# Patient Record
Sex: Male | Born: 1945 | Race: White | Hispanic: No | Marital: Married | Smoking: Never smoker
Health system: Southern US, Community
[De-identification: ages and names within clinical notes are randomized; demographics above are authoritative.]

## PROBLEM LIST (undated history)

## (undated) DIAGNOSIS — M1990 Unspecified osteoarthritis, unspecified site: Secondary | ICD-10-CM

## (undated) DIAGNOSIS — E041 Nontoxic single thyroid nodule: Secondary | ICD-10-CM

## (undated) DIAGNOSIS — K649 Unspecified hemorrhoids: Secondary | ICD-10-CM

## (undated) DIAGNOSIS — D709 Neutropenia, unspecified: Secondary | ICD-10-CM

## (undated) DIAGNOSIS — M869 Osteomyelitis, unspecified: Secondary | ICD-10-CM

## (undated) DIAGNOSIS — H53123 Transient visual loss, bilateral: Secondary | ICD-10-CM

## (undated) DIAGNOSIS — E7800 Pure hypercholesterolemia, unspecified: Secondary | ICD-10-CM

## (undated) HISTORY — DX: Osteomyelitis, unspecified: M86.9

## (undated) HISTORY — PX: TONSILLECTOMY: SUR1361

## (undated) HISTORY — PX: COLONOSCOPY: SHX174

## (undated) HISTORY — DX: Neutropenia, unspecified: D70.9

## (undated) HISTORY — PX: TOTAL HIP ARTHROPLASTY: SHX124

## (undated) HISTORY — DX: Pure hypercholesterolemia, unspecified: E78.00

## (undated) HISTORY — DX: Unspecified hemorrhoids: K64.9

## (undated) HISTORY — DX: Unspecified osteoarthritis, unspecified site: M19.90

## (undated) HISTORY — DX: Transient visual loss, bilateral: H53.123

## (undated) HISTORY — PX: WISDOM TOOTH EXTRACTION: SHX21

---

## 2015-05-02 ENCOUNTER — Ambulatory Visit (INDEPENDENT_AMBULATORY_CARE_PROVIDER_SITE_OTHER): Payer: Medicare Other | Admitting: *Deleted

## 2015-05-02 DIAGNOSIS — Z23 Encounter for immunization: Secondary | ICD-10-CM | POA: Diagnosis not present

## 2017-05-18 ENCOUNTER — Ambulatory Visit (INDEPENDENT_AMBULATORY_CARE_PROVIDER_SITE_OTHER): Payer: Medicare Other | Admitting: *Deleted

## 2017-05-18 DIAGNOSIS — Z23 Encounter for immunization: Secondary | ICD-10-CM

## 2019-07-18 ENCOUNTER — Other Ambulatory Visit: Payer: Self-pay

## 2019-07-18 ENCOUNTER — Encounter: Payer: Self-pay | Admitting: Neurology

## 2019-07-18 ENCOUNTER — Ambulatory Visit (INDEPENDENT_AMBULATORY_CARE_PROVIDER_SITE_OTHER): Payer: Medicare Other | Admitting: Neurology

## 2019-07-18 VITALS — BP 146/85 | HR 60 | Temp 97.5°F | Ht 70.0 in | Wt 156.5 lb

## 2019-07-18 DIAGNOSIS — H53123 Transient visual loss, bilateral: Secondary | ICD-10-CM | POA: Diagnosis not present

## 2019-07-18 DIAGNOSIS — M5412 Radiculopathy, cervical region: Secondary | ICD-10-CM | POA: Diagnosis not present

## 2019-07-18 NOTE — Progress Notes (Signed)
PATIENT: Brandon Zamora DOB: 05/06/1946  Chief Complaint  Patient presents with  . Transient Blindness    Reports two events of transient event.  The first episode was 1.5 years ago and lasted 4-6 seconds.  The second time was last month and also lasted 4-6 seconds.   Marland Kitchen. 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 Early CharsLewis, William B for evaluation of transient blindness, initial evaluation was on July 18, 2019.  I have reviewed and summarized the referring note from the referring physician.  Brandon Zamora had past medical history of hyperlipidemia, taking Lipitor 10 mg at nighttime, has been taking aspirin 81 mg over the past 6 months  Brandon Zamora reported 2 similar episodes of sudden onset visual loss, most recent one was in November 2020, after playing tennis for about 30 minutes, Brandon Zamora was driving back home, with both eyes open, had a sudden onset loss of vision, lasting only for seconds, no loss of consciousness, vision quickly came back, no lateralized motor or sensory deficit  Initial episode was in 2019, very similar, after playing tennis, while Brandon Zamora was driving home, Brandon Zamora had a sudden onset visual loss lasting for few seconds.  Brandon Zamora denies palpitation, chest pain,  Brandon Zamora also complains of chronic neck pain, sometimes radiating pain to bilateral shoulders, and lateral arm, there was no weakness, but on today's examination, Brandon Zamora was found to have mild right shoulder abduction, external rotation weakness.  REVIEW OF SYSTEMS: Full 14 system review of systems performed and notable only for as above All other review of systems were negative.  ALLERGIES: No Known Allergies  HOME MEDICATIONS: Current Outpatient Medications  Medication Sig Dispense Refill  . aspirin 81 MG EC tablet Take 81 mg by mouth daily.     Marland Kitchen. atorvastatin (LIPITOR) 10 MG tablet Take 10 mg by mouth at bedtime.     . cetirizine (ZYRTEC) 10  MG tablet Take 10 mg by mouth daily.     . Coenzyme Q10 200 MG capsule Take 200 mg by mouth daily.    . Multiple Vitamin (MULTIVITAMIN) tablet Take 1 tablet by mouth daily.    . Triamcinolone Acetonide (TRIAMCINOLONE 0.1 % CREAM : EUCERIN) CREA Apply 1 application topically as needed.    . TURMERIC CURCUMIN PO Take 200 mg by mouth daily.     No current facility-administered medications for this visit.    PAST MEDICAL HISTORY: Past Medical History:  Diagnosis Date  . Degenerative joint disease   . Hemorrhoids   . Hypercholesteremia   . Neutropenia (HCC)   . Osteomyelitis hip (HCC)   . Transient blindness of both eyes     PAST SURGICAL HISTORY: Past Surgical History:  Procedure Laterality Date  . COLONOSCOPY    . TONSILLECTOMY    . TOTAL HIP ARTHROPLASTY Bilateral     FAMILY HISTORY: Family History  Problem Relation Age of Onset  . Dementia Mother   . Heart disease Father     SOCIAL HISTORY: Social History   Socioeconomic History  . Marital status: Married    Spouse name: Not on file  . Number of children: 2  . Years of education: college  . Highest education level: Doctorate  Occupational History  . Occupation: Retired  Tobacco Use  . Smoking status: Never Smoker  . Smokeless tobacco: Never Used  Substance and Sexual Activity  . Alcohol use: Never  .  Drug use: Never  . Sexual activity: Not on file  Other Topics Concern  . Not on file  Social History Narrative   Lives at home with his wife.   Right-handed.   2 cups caffeine per day.   Social Determinants of Health   Financial Resource Strain:   . Difficulty of Paying Living Expenses: Not on file  Food Insecurity:   . Worried About Programme researcher, broadcasting/film/video in the Last Year: Not on file  . Ran Out of Food in the Last Year: Not on file  Transportation Needs:   . Lack of Transportation (Medical): Not on file  . Lack of Transportation (Non-Medical): Not on file  Physical Activity:   . Days of Exercise per  Week: Not on file  . Minutes of Exercise per Session: Not on file  Stress:   . Feeling of Stress : Not on file  Social Connections:   . Frequency of Communication with Friends and Family: Not on file  . Frequency of Social Gatherings with Friends and Family: Not on file  . Attends Religious Services: Not on file  . Active Member of Clubs or Organizations: Not on file  . Attends Banker Meetings: Not on file  . Marital Status: Not on file  Intimate Partner Violence:   . Fear of Current or Ex-Partner: Not on file  . Emotionally Abused: Not on file  . Physically Abused: Not on file  . Sexually Abused: Not on file     PHYSICAL EXAM   Vitals:   07/18/19 1019  BP: (!) 146/85  Pulse: 60  Temp: (!) 97.5 F (36.4 C)  Weight: 156 lb 8 oz (71 kg)  Height: 5\' 10"  (1.778 m)    Not recorded      Body mass index is 22.46 kg/m.  PHYSICAL EXAMNIATION:  Gen: NAD, conversant, well nourised, well groomed                     Cardiovascular: Regular rate rhythm, no peripheral edema, warm, nontender. Eyes: Conjunctivae clear without exudates or hemorrhage Neck: Supple, no carotid bruits. Pulmonary: Clear to auscultation bilaterally   NEUROLOGICAL EXAM:  MENTAL STATUS: Speech:    Speech is normal; fluent and spontaneous with normal comprehension.  Cognition:     Orientation to time, place and person     Normal recent and remote memory     Normal Attention span and concentration     Normal Language, naming, repeating,spontaneous speech     Fund of knowledge   CRANIAL NERVES: CN II: Visual fields are full to confrontation. Pupils are round equal and briskly reactive to light. CN III, IV, VI: extraocular movement are normal. No ptosis. CN V: Facial sensation is intact to light touch CN VII: Face is symmetric with normal eye closure  CN VIII: Hearing is normal to causal conversation. CN IX, X: Phonation is normal. CN XI: Head turning and shoulder shrug are  intact  MOTOR: Brandon Zamora has mild right shoulder abduction, external rotation, right elbow flexion weakness.  REFLEXES: Reflexes decreased at right 1/left 2 at biceps, symmetric at bilateral triceps, knees, and ankles. Plantar responses are flexor.  SENSORY: Intact to light touch, pinprick and vibratory sensation are intact in fingers and toes.  COORDINATION: There is no trunk or limb dysmetria noted.  GAIT/STANCE: Posture is normal. Gait is steady with normal steps, base, arm swing, and turning. Heel and toe walking are normal. Tandem gait is normal.  Romberg is absent.  DIAGNOSTIC DATA (LABS, IMAGING, TESTING) - I reviewed patient records, labs, notes, testing and imaging myself where available.   ASSESSMENT AND PLAN  Brandon Zamora is a 73 y.o. male   Sudden onset bilateral visual loss  Potential differentiation diagnosis includes decreased perfusion to bilateral occipital region, posterior circulation insufficiency  Proceed with MRI of the brain, CT angiogram of head and neck  We will also refer him to cardiologist to rule out cardiac arrhythmia,  Neck pain, radiating pain to bilateral shoulder, arm, right shoulder abduction, external rotation, elbow flexion weakness, decreased to right bicep reflex  Need to rule out right cervical radiculopathy  Proceed with MRI of cervical spine  Marcial Pacas, M.D. Ph.D.  [facility] [address] [place], [place] [address] Ph: [phone] Fax: [phone]  CC: Galen Manila, MD

## 2019-08-09 ENCOUNTER — Ambulatory Visit: Payer: Medicare Other | Attending: Internal Medicine

## 2019-08-09 DIAGNOSIS — Z23 Encounter for immunization: Secondary | ICD-10-CM | POA: Insufficient documentation

## 2019-08-09 NOTE — Progress Notes (Signed)
   Covid-19 Vaccination Clinic  Name:  Cylus Douville    MRN: [id number] DOB: 03-24-1946  08/09/2019  Mr. Ulatowski was observed post Covid-19 immunization for 15 minutes without incidence. He was provided with Vaccine Information Sheet and instruction to access the V-Safe system.   Mr. Ishaq was instructed to call 911 with any severe reactions post vaccine: Marland Kitchen Difficulty breathing  . Swelling of your face and throat  . A fast heartbeat  . A bad rash all over your body  . Dizziness and weakness    Immunizations Administered    Name Date Dose VIS Date Route   Pfizer COVID-19 Vaccine 08/09/2019  6:20 PM 0.3 mL 06/30/2019 Intramuscular   Manufacturer: [employer], [employer]   Lot: [id number]   NDC: [address]-1

## 2019-08-16 ENCOUNTER — Ambulatory Visit
Admission: RE | Admit: 2019-08-16 | Discharge: 2019-08-16 | Disposition: A | Discharge status: Home / Self Care | Payer: Medicare Other | Source: Ambulatory Visit | Attending: Neurology | Admitting: Neurology

## 2019-08-16 ENCOUNTER — Telehealth: Payer: Self-pay | Admitting: Neurology

## 2019-08-16 ENCOUNTER — Other Ambulatory Visit: Payer: Self-pay

## 2019-08-16 DIAGNOSIS — M5412 Radiculopathy, cervical region: Secondary | ICD-10-CM

## 2019-08-16 DIAGNOSIS — H53123 Transient visual loss, bilateral: Secondary | ICD-10-CM

## 2019-08-16 MED ORDER — IOPAMIDOL (ISOVUE-370) INJECTION 76%
75.0000 mL | Freq: Once | INTRAVENOUS | Status: AC | PRN
  Administered 2019-08-16: 75 mL via INTRAVENOUS

## 2019-08-16 NOTE — Telephone Encounter (Signed)
I have spoke to the patient and provided him with the results below. He verbalized understanding. He will contact his PCP to schedule evaluation of the thyroid findings.

## 2019-08-16 NOTE — Telephone Encounter (Signed)
Please call patient, CT angiogram of head and neck showed mild age-related changes, there is no significant large vessel disease,  Incidental findings of thyroid nodule at the right lower pole, with areas of calcification, maximum diameter 2.4 cm, radiology has recommended thyroid ultrasound, I have forward reported to his primary care physician Dr. 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 No intracranial anterior circulation large or medium vessel occlusion or significant distal vessel atherosclerotic irregularity.  No posterior circulation pathology. Both vertebral arteries widely patent. Posterior circulation branch vessels appear patent and normal.  Dominant thyroid nodule lower pole on the right with areas of calcification. Maximal diameter 2.4 cm. Recommend thyroid US (ref: J Am Coll Radiol. 2015 Feb;12(2): 143-50).

## 2019-08-18 ENCOUNTER — Other Ambulatory Visit: Payer: Self-pay | Admitting: Internal Medicine

## 2019-08-18 DIAGNOSIS — E079 Disorder of thyroid, unspecified: Secondary | ICD-10-CM

## 2019-08-21 ENCOUNTER — Telehealth: Payer: Self-pay | Admitting: Neurology

## 2019-08-21 NOTE — Telephone Encounter (Signed)
Left message requesting a all back to review the MRI results.  He was provided with his CTA of head and neck results last week. He verbalized understanding to follow up with his PCP concerning the thyroid findings.

## 2019-08-21 NOTE — Telephone Encounter (Signed)
I spoke to the patient and provided him with his MRI results outlined below.   He has already seen his PCP about the thyroid findings and has a pending appt for the ultrasound on 08/22/2019.

## 2019-08-21 NOTE — Telephone Encounter (Signed)
Please call patient, MRI of cervical spine showed multilevel degenerative changes,  severe bilateral foraminal stenosis at C3-4, C4-5, C6-7 level, severe right and moderate left foraminal stenosis at C5-6,  There is no evidence of spinal cord compression  MRI of the brain showed mild supratentorium small vessel disease, no acute abnormality  CT angiogram of head and neck showed no significant large vessel disease  Incidental findings of right thyroid lower pole nodule, maximum diameter 2.4 cm, recommended thyroid ultrasound, I have faxed the results to his primary care physician, Dr. Early Chars, he may contact Dr. Melvyn Neth for further thyroid evaluation.  MRI cervical spine (without)  - At C3-4, C4-5: disc bulging and uncovertebral joint hypertrophy with mild spinal stenosis and severe biforaminal stenosis; no cord signal changes. - At C5-6: disc bulging and uncovertebral joint hypertrophy with mild spinal stenosis, severe right and moderate left foraminal stenosis; no cord signal changes. - At C6-7: disc bulging and uncovertebral joint hypertrophy with severe biforaminal stenosis.  IMPRESSION:   MRI brain (without) demonstrating: - Mild scattered periventricular and subcortical foci of chronic small vessel ischemic disease. - No acute findings.  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 No intracranial anterior circulation large or medium vessel occlusion or significant distal vessel atherosclerotic irregularity.  No posterior circulation pathology. Both vertebral arteries widely patent. Posterior circulation branch vessels appear patent and normal.  Dominant thyroid nodule lower pole on the right with areas of calcification. Maximal diameter 2.4 cm. Recommend thyroid US (ref: J Am Coll Radiol. 2015 Feb;12(2): 143-50).

## 2019-08-22 ENCOUNTER — Ambulatory Visit
Admission: RE | Admit: 2019-08-22 | Discharge: 2019-08-22 | Disposition: A | Discharge status: Home / Self Care | Payer: Medicare Other | Source: Ambulatory Visit | Attending: Internal Medicine | Admitting: Internal Medicine

## 2019-08-22 DIAGNOSIS — E079 Disorder of thyroid, unspecified: Secondary | ICD-10-CM

## 2019-08-30 ENCOUNTER — Ambulatory Visit: Payer: Medicare Other | Attending: Internal Medicine

## 2019-08-30 DIAGNOSIS — Z23 Encounter for immunization: Secondary | ICD-10-CM | POA: Insufficient documentation

## 2019-08-30 NOTE — Progress Notes (Signed)
   Covid-19 Vaccination Clinic  Name:  Brandon Zamora    MRN: [id number] DOB: [birth year]  08/30/2019  Brandon Zamora was observed post Covid-19 immunization for 15 minutes without incidence. He was provided with Vaccine Information Sheet and instruction to access the V-Safe system.   Brandon Zamora was instructed to call 911 with any severe reactions post vaccine: Marland Kitchen Difficulty breathing  . Swelling of your face and throat  . A fast heartbeat  . A bad rash all over your body  . Dizziness and weakness    Immunizations Administered    Name Date Dose VIS Date Route   Pfizer COVID-19 Vaccine 08/30/2019  8:34 AM 0.3 mL 06/30/2019 Intramuscular   Manufacturer: [employer], [employer]   Lot: [id number]   NDC: [address]-1

## 2019-09-04 ENCOUNTER — Other Ambulatory Visit: Payer: Self-pay

## 2019-09-07 ENCOUNTER — Other Ambulatory Visit: Payer: Self-pay

## 2019-09-07 ENCOUNTER — Ambulatory Visit (INDEPENDENT_AMBULATORY_CARE_PROVIDER_SITE_OTHER): Payer: Medicare Other | Admitting: Internal Medicine

## 2019-09-07 DIAGNOSIS — E785 Hyperlipidemia, unspecified: Secondary | ICD-10-CM | POA: Diagnosis not present

## 2019-09-07 DIAGNOSIS — G453 Amaurosis fugax: Secondary | ICD-10-CM

## 2019-09-07 DIAGNOSIS — E042 Nontoxic multinodular goiter: Secondary | ICD-10-CM | POA: Diagnosis not present

## 2019-09-07 MED ORDER — ATORVASTATIN CALCIUM 20 MG PO TABS: 20.0000 mg | ORAL_TABLET | Freq: Every day | ORAL | 3 refills | Status: DC

## 2019-09-07 NOTE — Progress Notes (Signed)
Virtual Visit via Video Note  I connected with Brandon Zamora on 09/07/19 at 9:30 AM by a video enabled telemedicine application and verified that I am speaking with the correct person using two identifiers.   I discussed the limitations of evaluation and management by telemedicine and the availability of in person appointments. The patient expressed understanding and agreed to proceed.  -Location of the patient : Home  -Location of the provider : office  -The names of all persons participating in the telemedicine service : Pt and myself        Name: Brandon Zamora  MRN/ DOB: [id number], 09/15/1945    Age/ Sex: 74 y.o., male    PCP: Galen Manila, MD   Reason for Endocrinology Evaluation: Thyroid nodule      Date of Initial Endocrinology Evaluation: 09/07/2019     HPI: Brandon Zamora is a 74 y.o. male with a past medical history of dyslipidemia.The patient presented for initial endocrinology clinic visit on 09/07/2019 for consultative assistance with his thyroid nodules      Brandon Zamora presented to [facility] neurology for evaluation of transient loss of vision.  During imaging he was found to have an incidental right thyroid nodule on the CT scan.  This prompted a thyroid ultrasound which showed a right inferior thyroid nodule with a max diameter of 3.1 cm meeting criteria for FNA.  He also was found to have a left inferior thyroid nodule that does not meet criteria for FNA but would require an evaluation in 1 year.    Today he denies any local neck symptoms such as swelling, dysphagia, or pain.  He has minimal sensation of difficulty swallowing when lying supine.  Patient denies any weight changes, palpitations, or tremors.  He does endorse new onset constipation and cold intolerance.   He has been on Lipitor 10 mg for the past 2 years, he did have initial complaints of muscle aches and pains and was advised by cardiology to start CO Q 10 which has improved his  symptoms.      HISTORY:  Past Medical History:  Past Medical History:  Diagnosis Date  . Degenerative joint disease   . Hemorrhoids   . Hypercholesteremia   . Neutropenia ([facility])   . Osteomyelitis hip ([place])   . Transient blindness of both eyes     Past Surgical History:  Past Surgical History:  Procedure Laterality Date  . COLONOSCOPY    . TONSILLECTOMY    . TOTAL HIP ARTHROPLASTY Bilateral       Social History:  reports that he has never smoked. He has never used smokeless tobacco. He reports that he does not drink alcohol or use drugs.  Family History: family history includes Dementia in his mother; Heart disease in his father.   HOME MEDICATIONS: Allergies as of 09/07/2019   No Known Allergies     Medication List       Accurate as of September 07, 2019  8:23 AM. If you have any questions, ask your nurse or doctor.        aspirin 81 MG EC tablet Take 81 mg by mouth daily.   atorvastatin 10 MG tablet Commonly known as: LIPITOR Take 10 mg by mouth at bedtime.   cetirizine 10 MG tablet Commonly known as: ZYRTEC Take 10 mg by mouth daily.   Coenzyme Q10 200 MG capsule Take 200 mg by mouth daily.   multivitamin tablet Take 1 tablet by mouth daily.   triamcinolone 0.1 % cream :  eucerin Crea Apply 1 application topically as needed.   TURMERIC CURCUMIN PO Take 200 mg by mouth daily.         REVIEW OF SYSTEMS: A comprehensive ROS was conducted with the patient and is negative except as per HPI     DATA REVIEWED: 06/28/2019   LDL 140.6 mg/dL  Bun/Cr. 14/0.77  GFR 91  Ca 9.2    ASSESSMENT/PLAN/RECOMMENDATIONS:   1. Multinodular Goiter:   -Clinically he has some symptoms of hypothyroidism.  No TFTs available at this time. -No local neck symptoms -We discussed that multinodular goiter is a very common condition and most of the time patients are asymptomatic and found incidentally as in his case.  We discussed that 95 to 97% of thyroid nodules  are benign. -We reviewed his thyroid ultrasound results, patient in agreement to proceed with FNA of the right inferior thyroid nodule. -Orders have been entered -Patient will stop by for TSH and free T4 check on the day he comes for FNA.  2.  Dyslipidemia/amaurosis fugax:  -Patient is under the care of neurology, he is already on daily aspirin -I have reviewed his LDL levels from December 2020 through [facility], LDL level is 140 mg/dL.  I have advised the patient to increase Lipitor to 20 mg daily. -Since he has been on the CO Q10, his myalgias have subsided.  I have advised him that if he has myalgias he may start taking vitamin D3 1000 IU daily, I have attempted to order this for him but unfortunately this is not covered by his insurance. -We will need to repeat panels within few months of increasing the dose of Lipitor   Medications : Increase Lipitor to 20 mg daily  Follow-up in 6 months  Signed electronically by: Lyndle Herrlich, MD  [facility] Endocrinology  [facility] [address] [place] [place], [place] [address] Phone: [phone] FAX: [phone]   CC: Theodoro Kos, MD [id number] [facility] [address] [place] [address] Phone: [phone] Fax: [phone]   Return to Endocrinology clinic as below: Future Appointments  Date Time Provider Department Center  09/07/2019  9:30 AM Cleston Lautner, Konrad Dolores, MD [facility] None  10/17/2019 11:00 AM Levert Feinstein, MD [facility] None

## 2019-09-19 ENCOUNTER — Other Ambulatory Visit (INDEPENDENT_AMBULATORY_CARE_PROVIDER_SITE_OTHER): Payer: Medicare Other

## 2019-09-19 ENCOUNTER — Other Ambulatory Visit (HOSPITAL_COMMUNITY)
Admission: RE | Admit: 2019-09-19 | Discharge: 2019-09-19 | Disposition: A | Discharge status: Home / Self Care | Payer: Medicare Other | Source: Ambulatory Visit | Attending: Radiology | Admitting: Radiology

## 2019-09-19 ENCOUNTER — Ambulatory Visit
Admission: RE | Admit: 2019-09-19 | Discharge: 2019-09-19 | Disposition: A | Discharge status: Home / Self Care | Payer: Medicare Other | Source: Ambulatory Visit | Attending: Internal Medicine | Admitting: Internal Medicine

## 2019-09-19 ENCOUNTER — Other Ambulatory Visit: Payer: Self-pay

## 2019-09-19 DIAGNOSIS — E042 Nontoxic multinodular goiter: Secondary | ICD-10-CM | POA: Insufficient documentation

## 2019-09-19 LAB — T4, FREE: Free T4: 0.76 ng/dL (ref 0.60–1.60)

## 2019-09-19 LAB — TSH: TSH: 3.53 u[IU]/mL (ref 0.35–4.50)

## 2019-09-20 ENCOUNTER — Telehealth: Payer: Self-pay | Admitting: Internal Medicine

## 2019-09-20 LAB — CYTOLOGY - NON PAP

## 2019-09-20 NOTE — Telephone Encounter (Signed)
Discussed lab results as below    Results for Brandon Zamora, Brandon Zamora (MRN [id number]) as of 09/20/2019 16:56  Ref. Range 09/19/2019 09:06  TSH Latest Ref Range: 0.35 - 4.50 uIU/mL 3.53  T4,Free(Direct) Latest Ref Range: 0.60 - 1.60 ng/dL 9.53     Clinical History: Right inferior 3.1cm; Other 2 dimensions: 2.0 x 1.7cm,  Peripheral calcifications, TI-RADS total points 6  Specimen Submitted: A. THYROID, RLP, FINE NEEDLE ASPIRATION:    FINAL MICROSCOPIC DIAGNOSIS:  - Follicular lesion of undetermined significance (Bethesda category III)     Awaiting on Afirma , which may take 2-4 weeks   Pt expressed understanding of the above    Abby Raelyn Mora, MD  [facility] Endocrinology  [facility] [address] [place], [place] [address] Phone: [phone] FAX: [phone]

## 2019-10-03 ENCOUNTER — Telehealth: Payer: Self-pay | Admitting: Internal Medicine

## 2019-10-03 NOTE — Telephone Encounter (Signed)
Brandon Zamora , can you please  Let Dr. Gerre Pebbles know his afirma testing ( which is a molecular testing for cancer genes) on his thyroid nodule has come back  Benign.    No further intervention is needed at this time, will recheck on his next visit          Thanks    Abby Raelyn Mora, MD  [facility] Endocrinology  [facility] [address] [place], [place] [address] Phone: [phone] FAX: [phone]

## 2019-10-03 NOTE — Telephone Encounter (Signed)
Lft vm to return call 

## 2019-10-03 NOTE — Telephone Encounter (Signed)
Pt aware of results 

## 2019-10-13 ENCOUNTER — Encounter (HOSPITAL_COMMUNITY): Payer: Self-pay

## 2019-10-17 ENCOUNTER — Ambulatory Visit (INDEPENDENT_AMBULATORY_CARE_PROVIDER_SITE_OTHER): Payer: Medicare Other | Admitting: Neurology

## 2019-10-17 ENCOUNTER — Other Ambulatory Visit: Payer: Self-pay

## 2019-10-17 ENCOUNTER — Encounter: Payer: Self-pay | Admitting: Neurology

## 2019-10-17 VITALS — BP 158/83 | HR 53 | Temp 97.5°F | Ht 70.0 in | Wt 160.0 lb

## 2019-10-17 DIAGNOSIS — H53123 Transient visual loss, bilateral: Secondary | ICD-10-CM

## 2019-10-17 DIAGNOSIS — M5412 Radiculopathy, cervical region: Secondary | ICD-10-CM

## 2019-10-17 NOTE — Progress Notes (Addendum)
PATIENT: Brandon Zamora DOB: [birth year]  Chief Complaint  Patient presents with  . Transient blindness of eyes/Neck Pain    He is here to review his MRI brain, CTA head/neck, MRI cervical results.     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 Early Chars B for evaluation of transient blindness, initial evaluation was on July 18, 2019.  I have reviewed and summarized the referring note from the referring physician.  He had past medical history of hyperlipidemia, taking Lipitor 10 mg at nighttime, has been taking aspirin 81 mg over the past 6 months  He reported 2 similar episodes of sudden onset visual loss, most recent one was in November 2020, after playing tennis for about 30 minutes, he was driving back home, with both eyes open, had a sudden onset loss of vision, lasting only for seconds, no loss of consciousness, vision quickly came back, no lateralized motor or sensory deficit  Initial episode was in 2019, very similar, after playing tennis, while he was driving home, he had a sudden onset visual loss lasting for few seconds.  He denies palpitation, chest pain,  He also complains of chronic neck pain, sometimes radiating pain to bilateral shoulders, and lateral arm, there was no weakness, but on today's examination, he was found to have mild right shoulder abduction, external rotation weakness.  UPDATE October 17 2019: He continues to do well, there was no recurrent episode of transient visual loss, cardiac work-up is pending  He has intermittent neck pain, occasionally radiating pain to right shoulder, he also reported a history of right shoulder injury in the past, but denies limited range of motion of right shoulder, denied noticeable muscle weakness, on examination, he was noted to have mild right shoulder abduction weakness  We have personally reviewed MRI of the brain without contrast in January  2021, mild age-related changes, scattered subcortical small vessel disease, no acute abnormality  MRI of cervical spine, multilevel degenerative changes, involving C3-4, C4-5, C5-6, C6-7, no evidence of cord compression, variable degree of foraminal narrowing, incidental findings of right thyroid nodule, biopsy showed no significant abnormality.  CT angiogram of head and neck showed no significant large vessel disease  Laboratory evaluation showed normal thyroid functional test,  REVIEW OF SYSTEMS: Full 14 system review of systems performed and notable only for as above All other review of systems were negative.  ALLERGIES: No Known Allergies  HOME MEDICATIONS: Current Outpatient Medications  Medication Sig Dispense Refill  . aspirin 81 MG EC tablet Take 81 mg by mouth daily.     Marland Kitchen atorvastatin (LIPITOR) 20 MG tablet Take 1 tablet (20 mg total) by mouth daily. 90 tablet 3  . cetirizine (ZYRTEC) 10 MG tablet Take 10 mg by mouth daily.     . Coenzyme Q10 200 MG capsule Take 200 mg by mouth daily.    . Multiple Vitamin (MULTIVITAMIN) tablet Take 1 tablet by mouth daily.    . Triamcinolone Acetonide (TRIAMCINOLONE 0.1 % CREAM : EUCERIN) CREA Apply 1 application topically as needed.    . TURMERIC CURCUMIN PO Take 200 mg by mouth daily.     No current facility-administered medications for this visit.    PAST MEDICAL HISTORY: Past Medical History:  Diagnosis Date  . Degenerative joint disease   . Hemorrhoids   . Hypercholesteremia   . Neutropenia (HCC)   . Osteomyelitis hip (HCC)   . Transient blindness of both eyes  PAST SURGICAL HISTORY: Past Surgical History:  Procedure Laterality Date  . COLONOSCOPY    . TONSILLECTOMY    . TOTAL HIP ARTHROPLASTY Bilateral     FAMILY HISTORY: Family History  Problem Relation Age of Onset  . Dementia Mother   . Heart disease Father     SOCIAL HISTORY: Social History   Socioeconomic History  . Marital status: Married    Spouse  name: Not on file  . Number of children: 2  . Years of education: college  . Highest education level: Doctorate  Occupational History  . Occupation: Retired  Tobacco Use  . Smoking status: Never Smoker  . Smokeless tobacco: Never Used  Substance and Sexual Activity  . Alcohol use: Never  . Drug use: Never  . Sexual activity: Not on file  Other Topics Concern  . Not on file  Social History Narrative   Lives at home with his wife.   Right-handed.   2 cups caffeine per day.   Social Determinants of Health   Financial Resource Strain:   . Difficulty of Paying Living Expenses:   Food Insecurity:   . Worried About Programme researcher, broadcasting/film/video in the Last Year:   . Barista in the Last Year:   Transportation Needs:   . Freight forwarder (Medical):   Marland Kitchen Lack of Transportation (Non-Medical):   Physical Activity:   . Days of Exercise per Week:   . Minutes of Exercise per Session:   Stress:   . Feeling of Stress :   Social Connections:   . Frequency of Communication with Friends and Family:   . Frequency of Social Gatherings with Friends and Family:   . Attends Religious Services:   . Active Member of Clubs or Organizations:   . Attends Banker Meetings:   Marland Kitchen Marital Status:   Intimate Partner Violence:   . Fear of Current or Ex-Partner:   . Emotionally Abused:   Marland Kitchen Physically Abused:   . Sexually Abused:      PHYSICAL EXAM   Vitals:   10/17/19 1054  BP: (!) 158/83  Pulse: (!) 53  Temp: (!) 97.5 F (36.4 C)  Weight: 160 lb (72.6 kg)  Height: 5\' 10"  (1.778 m)    Not recorded      Body mass index is 22.96 kg/m.  PHYSICAL EXAMNIATION:  Gen: NAD, conversant, well nourised, well groomed                     Cardiovascular: Regular rate rhythm, no peripheral edema, warm, nontender. Eyes: Conjunctivae clear without exudates or hemorrhage Neck: Supple, no carotid bruits. Pulmonary: Clear to auscultation bilaterally   NEUROLOGICAL EXAM:  MENTAL  STATUS: Speech:    Speech is normal; fluent and spontaneous with normal comprehension.  Cognition:     Orientation to time, place and person     Normal recent and remote memory     Normal Attention span and concentration     Normal Language, naming, repeating,spontaneous speech     Fund of knowledge   CRANIAL NERVES: CN II: Visual fields are full to confrontation. Pupils are round equal and briskly reactive to light. CN III, IV, VI: extraocular movement are normal. No ptosis. CN V: Facial sensation is intact to light touch CN VII: Face is symmetric with normal eye closure  CN VIII: Hearing is normal to causal conversation. CN IX, X: Phonation is normal. CN XI: Head turning and shoulder shrug are intact  MOTOR: He has mild right shoulder abduction, external rotation weakness  REFLEXES: Reflexes decreased at right 1/left 2 at biceps, symmetric at bilateral triceps, knees, and ankles. Plantar responses are flexor.  SENSORY: Intact to light touch, pinprick and vibratory sensation are intact in fingers and toes.  COORDINATION: There is no trunk or limb dysmetria noted.  GAIT/STANCE: Posture is normal. Gait is steady with normal steps, base, arm swing, and turning.   DIAGNOSTIC DATA (LABS, IMAGING, TESTING) - I reviewed patient records, labs, notes, testing and imaging myself where available.   ASSESSMENT AND PLAN  Brandon Zamora is a 74 y.o. male   Sudden onset bilateral visual loss  Potential differentiation diagnosis includes decreased perfusion to bilateral occipital region, posterior circulation insufficiency  MRI of the brain showed no significant abnormality  CT angiogram of head and neck was normal  Cardiac work-up is pending  Neck pain, radiating pain to bilateral shoulder, arm, right shoulder abduction, external rotation weakness  MRI of cervical spine showed multilevel cervical degenerative changes, no evidence of cord compression, severe bilateral foraminal  stenosis at C3-4, C4-5, C6-7, severe right, moderate left foraminal stenosis at C5-6,  I have suggested him continue moderate exercise, stretching exercise, hot compression,  Total time spent reviewing the chart, obtaining history, examined patient, ordering tests, documentation, consultations and family, care coordination was 30 minutes   Marcial Pacas, M.D. Ph.D.  [facility] [address], [place], [place] [address] Ph: [phone] Fax: [phone]  CC: Galen Manila, MD  Addendum: by Dr. Deri Fuelling C3-4, C4-5, C5-6, C6-7 anterior cervical discectomy with interbody fusion utilizing interbody peek cages and locally harvested autograft with anterior plate instrumentation

## 2019-11-14 ENCOUNTER — Telehealth: Payer: Self-pay | Admitting: Neurology

## 2019-11-14 DIAGNOSIS — M5412 Radiculopathy, cervical region: Secondary | ICD-10-CM

## 2019-11-14 NOTE — Telephone Encounter (Signed)
Dr Terrace Arabia, thank you for your quick response.   I visited my family physician today and he suggested I ask you to refer me to a Neurosurgeon  to look at the MRI and suggest treatment options.   If anything, my loss of strength and mobility have increased since I visited with you.                                                                                            Best regards,  Brandon Zamora   Referred to neurosurgeon for right cervical radiculopathy

## 2019-11-15 NOTE — Telephone Encounter (Signed)
Referral has been sent via Fax

## 2019-11-27 ENCOUNTER — Other Ambulatory Visit: Payer: Self-pay | Admitting: Neurosurgery

## 2019-12-06 NOTE — Progress Notes (Signed)
[facility] [place] - [address] [place] [address] Phone: [phone] Fax: [phone]   Your procedure is scheduled on Friday, May 28th.  Report to [facility] Main Entrance "A" at 6:00 A.M., and check in at the Admitting office.  Call this number if you have problems the morning of surgery:  [phone]  Call [phone] if you have any questions prior to your surgery date Monday-Friday 8am-4pm   Remember:  Do not eat or drink after midnight the night before your surgery   Take these medicines the morning of surgery with A SIP OF WATER  atorvastatin (LIPITOR) cetirizine (ZYRTEC)  Follow your surgeon's instructions on when to stop Aspirin.  If no instructions were given by your surgeon then you will need to call the office to get those instructions.    As of today, STOP taking any Aspirin (unless otherwise instructed by your surgeon) and Aspirin containing products, Aleve, Naproxen, Ibuprofen, Motrin, Advil, Goody's, BC's, all herbal medications, fish oil, and all vitamins.                     Do not wear jewelry.            Do not wear lotions, powders, colognes, or deodorant.            Men may shave face and neck.            Do not bring valuables to the hospital.            [facility] is not responsible for any belongings or valuables.  Do NOT Smoke (Tobacco/Vapping) or drink Alcohol 24 hours prior to your procedure If you use a CPAP at night, you may bring all equipment for your overnight stay.   Contacts, glasses, dentures or bridgework may not be worn into surgery.      For patients admitted to the hospital, discharge time will be determined by your treatment team.   Patients discharged the day of surgery will not be allowed to drive home, and someone needs to stay with them for 24 hours.  Special instructions:   [place]- Preparing For Surgery  Before surgery, you can play an important role. Because skin is not  sterile, your skin needs to be as free of germs as possible. You can reduce the number of germs on your skin by washing with CHG (chlorahexidine gluconate) Soap before surgery.  CHG is an antiseptic cleaner which kills germs and bonds with the skin to continue killing germs even after washing.    Oral Hygiene is also important to reduce your risk of infection.  Remember - BRUSH YOUR TEETH THE MORNING OF SURGERY WITH YOUR REGULAR TOOTHPASTE  Please do not use if you have an allergy to CHG or antibacterial soaps. If your skin becomes reddened/irritated stop using the CHG.  Do not shave (including legs and underarms) for at least 48 hours prior to first CHG shower. It is OK to shave your face.  Please follow these instructions carefully.   1. Shower the NIGHT BEFORE SURGERY and the MORNING OF SURGERY with CHG Soap.   2. If you chose to wash your hair, wash your hair first as usual with your normal shampoo.  3. After you shampoo, rinse your hair and body thoroughly to remove the shampoo.  4. Use CHG as you would any other liquid soap. You can apply CHG directly to the skin and wash gently with  a scrungie or a clean washcloth.   5. Apply the CHG Soap to your body ONLY FROM THE NECK DOWN.  Do not use on open wounds or open sores. Avoid contact with your eyes, ears, mouth and genitals (private parts). Wash Face and genitals (private parts)  with your normal soap.   6. Wash thoroughly, paying special attention to the area where your surgery will be performed.  7. Thoroughly rinse your body with warm water from the neck down.  8. DO NOT shower/wash with your normal soap after using and rinsing off the CHG Soap.  9. Pat yourself dry with a CLEAN TOWEL.  10. Wear CLEAN PAJAMAS to bed the night before surgery, wear comfortable clothes the morning of surgery  11. Place CLEAN SHEETS on your bed the night of your first shower and DO NOT SLEEP WITH PETS.  Day of Surgery: Shower with CHG as instructed  above.  Do not apply any deodorants/lotions.  Please wear clean clothes to the hospital/surgery center.   Remember to brush your teeth WITH YOUR REGULAR TOOTHPASTE.   Please read over the following fact sheets that you were given.

## 2019-12-07 ENCOUNTER — Encounter (HOSPITAL_COMMUNITY): Payer: Self-pay

## 2019-12-07 ENCOUNTER — Encounter (HOSPITAL_COMMUNITY)
Admission: RE | Admit: 2019-12-07 | Discharge: 2019-12-07 | Disposition: A | Discharge status: Home / Self Care | Payer: Medicare Other | Source: Ambulatory Visit | Attending: Neurosurgery | Admitting: Neurosurgery

## 2019-12-07 ENCOUNTER — Other Ambulatory Visit: Payer: Self-pay

## 2019-12-07 DIAGNOSIS — H53123 Transient visual loss, bilateral: Secondary | ICD-10-CM | POA: Insufficient documentation

## 2019-12-07 DIAGNOSIS — R001 Bradycardia, unspecified: Secondary | ICD-10-CM | POA: Insufficient documentation

## 2019-12-07 DIAGNOSIS — Z01818 Encounter for other preprocedural examination: Secondary | ICD-10-CM | POA: Diagnosis not present

## 2019-12-07 DIAGNOSIS — I440 Atrioventricular block, first degree: Secondary | ICD-10-CM | POA: Diagnosis not present

## 2019-12-07 HISTORY — DX: Nontoxic single thyroid nodule: E04.1

## 2019-12-07 LAB — CBC WITH DIFFERENTIAL/PLATELET
Abs Immature Granulocytes: 0.01 10*3/uL (ref 0.00–0.07)
Basophils Absolute: 0 10*3/uL (ref 0.0–0.1)
Basophils Relative: 1 %
Eosinophils Absolute: 0.1 10*3/uL (ref 0.0–0.5)
Eosinophils Relative: 2 %
HCT: 45.9 % (ref 39.0–52.0)
Hemoglobin: 15 g/dL (ref 13.0–17.0)
Immature Granulocytes: 0 %
Lymphocytes Relative: 26 %
Lymphs Abs: 0.8 10*3/uL (ref 0.7–4.0)
MCH: 31.1 pg (ref 26.0–34.0)
MCHC: 32.7 g/dL (ref 30.0–36.0)
MCV: 95 fL (ref 80.0–100.0)
Monocytes Absolute: 0.3 10*3/uL (ref 0.1–1.0)
Monocytes Relative: 11 %
Neutro Abs: 2 10*3/uL (ref 1.7–7.7)
Neutrophils Relative %: 60 %
Platelets: 223 10*3/uL (ref 150–400)
RBC: 4.83 MIL/uL (ref 4.22–5.81)
RDW: 12.9 % (ref 11.5–15.5)
WBC: 3.2 10*3/uL — ABNORMAL LOW (ref 4.0–10.5)
nRBC: 0 % (ref 0.0–0.2)

## 2019-12-07 LAB — BASIC METABOLIC PANEL
Anion gap: 7 (ref 5–15)
BUN: 13 mg/dL (ref 8–23)
CO2: 27 mmol/L (ref 22–32)
Calcium: 9.4 mg/dL (ref 8.9–10.3)
Chloride: 105 mmol/L (ref 98–111)
Creatinine, Ser: 0.79 mg/dL (ref 0.61–1.24)
GFR calc Af Amer: >60 mL/min (ref >60)
GFR calc non Af Amer: >60 mL/min (ref >60)
Glucose, Bld: 94 mg/dL (ref 70–99)
Potassium: 4.6 mmol/L (ref 3.5–5.1)
Sodium: 139 mmol/L (ref 135–145)

## 2019-12-07 LAB — SURGICAL PCR SCREEN
MRSA, PCR: NEGATIVE
Staphylococcus aureus: NEGATIVE

## 2019-12-07 NOTE — Progress Notes (Signed)
PCP - Wm . Lewis @ [facility] Cardiologist - Jackie Pl[facility] in [place], [place]    Chest x-ray - na EKG - requested Stress Test - 4/21 ECHO - 4/21 Cardiac Cath - na  Sleep Study - na CPAP - na  Fasting Blood Sugar - na   Blood Thinner Instructions:na Aspirin Instructions: stop 12/08/19  ERAS Protcol -na   COVID TEST- 12/14/19   Anesthesia review:   Patient denies shortness of breath, fever, cough and chest pain at PAT appointment   All instructions explained to the patient, with a verbal understanding of the material. Patient agrees to go over the instructions while at home for a better understanding. Patient also instructed to self quarantine after being tested for COVID-19. The opportunity to ask questions was provided.

## 2019-12-11 NOTE — Progress Notes (Signed)
Anesthesia Chart Review:  Pt has history of 2 episodes of transient bilateral vision loss. Per notes, no other symptoms reported. He recently underwent thorough neurologic and cardiac workups, both of which were benign. Per neurologist Dr. Zannie Cove last note 10/17/19, "Sudden onset bilateral visual loss : Potential differentiation diagnosis includes decreased perfusion to bilateral occipital region, posterior circulation insufficiency. MRI of the brain showed no significant abnormality. CT angiogram of head and neck was normal, Cardiac work-up is pending."  Echo 10/30/2019 showed normal biventricular dimensions and systolic function, no major valvular abnormalities.  Exercise stress test 10/24/2019 was normal.  Cardiac clearance from Dr. Catha Gosselin dated 12/08/19 states pt low risk for surgery.   Per last PCP note 11/14/19, "Transient blindness of both eyes: full opthalmology cardiac and nerological work up negative and reviewed. No recurrence."  Thyroid nodules followed by endocrinology, recent biopsy was benign.  Preop labs reviewed, unremarkable.  EKG 10/24/2019 (copy on chart): Sinus bradycardia with first-degree AV block.  Septal infarct, age undetermined.  Rate 55.  TTE 10/30/2019 (copy on chart): Conclusions: 1.  Normal biventricular dimensions and systolic function. 2.  No major valvular abnormalities noted. 3.  Mild pulmonary hypertension. 4.  Increased mobility of the inter atrial septum.  PFO cannot be excluded.  Exercise stress test 10/24/2019 (copy on chart): Interpretation: Summary: Resting ECG: Sinus bradycardia.  Functional capacity: Normal.  Heart rate response to exercise: Appropriate.  BP response to exercise: Resting hypertension-appropriate response.  Chest pain: None.  Arrhythmias: Atrial premature beats-isolated, ventricular premature beats-isolated.  ST changes: None.  Overall impression: Normal stress test.  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 No intracranial anterior circulation large or medium vessel occlusion or significant distal vessel atherosclerotic irregularity.  No posterior circulation pathology. Both vertebral arteries widely patent. Posterior circulation branch vessels appear patent and normal.  Dominant thyroid nodule lower pole on the right with areas of calcification. Maximal diameter 2.4 cm. Recommend thyroid US (ref: J Am Coll Radiol. 2015 Feb;12(2): 143-50).  MRI cervical spine 08/16/19: - At C3-4, C4-5: disc bulging and uncovertebral joint hypertrophy with mild spinal stenosis and severe biforaminal stenosis; no cord signal changes. - At C5-6: disc bulging and uncovertebral joint hypertrophy with mild spinal stenosis, severe right and moderate left foraminal stenosis; no cord signal changes. - At C6-7: disc bulging and uncovertebral joint hypertrophy with severe biforaminal stenosis.   Zannie Cove [facility] Short Stay Center/Anesthesiology Phone [phone] 12/12/2019 2:42 PM

## 2019-12-12 NOTE — Anesthesia Preprocedure Evaluation (Addendum)
Anesthesia Evaluation  Patient identified by MRN, date of birth, ID band Patient awake    Reviewed: Allergy & Precautions, NPO status , Patient's Chart, lab work & pertinent test results  Airway Mallampati: II  TM Distance: >3 FB Neck ROM: Limited    Dental  (+) Teeth Intact, Dental Advisory Given   Pulmonary    breath sounds clear to auscultation       Cardiovascular  Rhythm:Regular Rate:Normal     Neuro/Psych    GI/Hepatic   Endo/Other    Renal/GU      Musculoskeletal   Abdominal   Peds  Hematology   Anesthesia Other Findings   Reproductive/Obstetrics                             Anesthesia Physical Anesthesia Plan  ASA: II  Anesthesia Plan: General   Post-op Pain Management:    Induction: Intravenous  PONV Risk Score and Plan: Ondansetron and Dexamethasone  Airway Management Planned: Oral ETT  Additional Equipment:   Intra-op Plan:   Post-operative Plan: Extubation in OR  Informed Consent: I have reviewed the patients History and Physical, chart, labs and discussed the procedure including the risks, benefits and alternatives for the proposed anesthesia with the patient or authorized representative who has indicated his/her understanding and acceptance.     Dental advisory given  Plan Discussed with:   Anesthesia Plan Comments: (PAT note by Antionette Poles, PA-C: Pt has history of 2 episodes of transient bilateral vision loss. Per notes, no other symptoms reported. He recently underwent thorough neurologic and cardiac workups, both of which were benign. Per neurologist Dr. Zannie Cove last note 10/17/19, "Sudden onset bilateral visual loss : Potential differentiation diagnosis includes decreased perfusion to bilateral occipital region, posterior circulation insufficiency. MRI of the brain showed no significant abnormality. CT angiogram of head and neck was normal, Cardiac work-up is  pending."  Echo 10/30/2019 showed normal biventricular dimensions and systolic function, no major valvular abnormalities.  Exercise stress test 10/24/2019 was normal.  Cardiac clearance from Dr. Catha Gosselin dated 12/08/19 states pt low risk for surgery.   Per last PCP note 11/14/19, "Transient blindness of both eyes: full opthalmology cardiac and nerological work up negative and reviewed. No recurrence."  Thyroid nodules followed by endocrinology, recent biopsy was benign.  Preop labs reviewed, unremarkable.  EKG 10/24/2019 (copy on chart): Sinus bradycardia with first-degree AV block.  Septal infarct, age undetermined.  Rate 55.  TTE 10/30/2019 (copy on chart): Conclusions: 1.  Normal biventricular dimensions and systolic function. 2.  No major valvular abnormalities noted. 3.  Mild pulmonary hypertension. 4.  Increased mobility of the inter atrial septum.  PFO cannot be excluded.  Exercise stress test 10/24/2019 (copy on chart): Interpretation: Summary: Resting ECG: Sinus bradycardia.  Functional capacity: Normal.  Heart rate response to exercise: Appropriate.  BP response to exercise: Resting hypertension-appropriate response.  Chest pain: None.  Arrhythmias: Atrial premature beats-isolated, ventricular premature beats-isolated.  ST changes: None.  Overall impression: Normal stress test.  CTA head/neck 08/16/19: IMPRESSION: Nonstenotic atherosclerotic disease at both carotid bifurcations. No intracranial anterior circulation large or medium vessel occlusion or significant distal vessel atherosclerotic irregularity.  No posterior circulation pathology. Both vertebral arteries widely patent. Posterior circulation branch vessels appear patent and normal.  Dominant thyroid nodule lower pole on the right with areas of calcification. Maximal diameter 2.4 cm. Recommend thyroid US (ref: J Am Coll Radiol. 2015 Feb;12(2): 143-50).  MRI cervical spine 08/16/19: -  At C3-4, C4-5: disc bulging and  uncovertebral joint hypertrophy with mild spinal stenosis and severe biforaminal stenosis; no cord signal changes. - At C5-6: disc bulging and uncovertebral joint hypertrophy with mild spinal stenosis, severe right and moderate left foraminal stenosis; no cord signal changes. - At C6-7: disc bulging and uncovertebral joint hypertrophy with severe biforaminal stenosis. )       Anesthesia Quick Evaluation

## 2019-12-14 ENCOUNTER — Other Ambulatory Visit (HOSPITAL_COMMUNITY)
Admission: RE | Admit: 2019-12-14 | Discharge: 2019-12-14 | Disposition: A | Discharge status: Home / Self Care | Payer: Medicare Other | Source: Ambulatory Visit | Attending: Neurosurgery | Admitting: Neurosurgery

## 2019-12-14 DIAGNOSIS — Z20822 Contact with and (suspected) exposure to covid-19: Secondary | ICD-10-CM | POA: Insufficient documentation

## 2019-12-14 DIAGNOSIS — Z01812 Encounter for preprocedural laboratory examination: Secondary | ICD-10-CM | POA: Insufficient documentation

## 2019-12-14 LAB — SARS CORONAVIRUS 2 (TAT 6-24 HRS): SARS Coronavirus 2: NEGATIVE

## 2019-12-15 ENCOUNTER — Encounter (HOSPITAL_COMMUNITY): Payer: Self-pay | Admitting: Neurosurgery

## 2019-12-15 ENCOUNTER — Encounter (HOSPITAL_COMMUNITY)
Admission: RE | Disposition: A | Discharge status: Home / Self Care | Payer: Self-pay | Source: Home / Self Care | Attending: Neurosurgery

## 2019-12-15 ENCOUNTER — Inpatient Hospital Stay (HOSPITAL_COMMUNITY): Payer: Medicare Other

## 2019-12-15 ENCOUNTER — Other Ambulatory Visit: Payer: Self-pay

## 2019-12-15 ENCOUNTER — Observation Stay (HOSPITAL_COMMUNITY)
Admission: RE | Admit: 2019-12-15 | Discharge: 2019-12-16 | Disposition: A | Discharge status: Home / Self Care | Payer: Medicare Other | Attending: Neurosurgery | Admitting: Neurosurgery

## 2019-12-15 ENCOUNTER — Inpatient Hospital Stay (HOSPITAL_COMMUNITY): Payer: Medicare Other | Admitting: Vascular Surgery

## 2019-12-15 ENCOUNTER — Inpatient Hospital Stay (HOSPITAL_COMMUNITY): Payer: Medicare Other | Admitting: Certified Registered"

## 2019-12-15 DIAGNOSIS — E7800 Pure hypercholesterolemia, unspecified: Secondary | ICD-10-CM | POA: Insufficient documentation

## 2019-12-15 DIAGNOSIS — Z96643 Presence of artificial hip joint, bilateral: Secondary | ICD-10-CM | POA: Insufficient documentation

## 2019-12-15 DIAGNOSIS — M4802 Spinal stenosis, cervical region: Secondary | ICD-10-CM | POA: Diagnosis not present

## 2019-12-15 DIAGNOSIS — M4712 Other spondylosis with myelopathy, cervical region: Secondary | ICD-10-CM | POA: Diagnosis not present

## 2019-12-15 DIAGNOSIS — M4722 Other spondylosis with radiculopathy, cervical region: Secondary | ICD-10-CM | POA: Insufficient documentation

## 2019-12-15 DIAGNOSIS — Z419 Encounter for procedure for purposes other than remedying health state, unspecified: Secondary | ICD-10-CM

## 2019-12-15 DIAGNOSIS — Z79899 Other long term (current) drug therapy: Secondary | ICD-10-CM | POA: Diagnosis not present

## 2019-12-15 DIAGNOSIS — Z7982 Long term (current) use of aspirin: Secondary | ICD-10-CM | POA: Diagnosis not present

## 2019-12-15 HISTORY — PX: ANTERIOR CERVICAL DECOMPRESSION/DISCECTOMY FUSION 4 LEVELS: SHX5556

## 2019-12-15 SURGERY — ANTERIOR CERVICAL DECOMPRESSION/DISCECTOMY FUSION 4 LEVELS
Anesthesia: General

## 2019-12-15 MED ORDER — THROMBIN 5000 UNITS EX SOLR
OROMUCOSAL | Status: DC | PRN
  Administered 2019-12-15: 5 mL via TOPICAL

## 2019-12-15 MED ORDER — HYDROMORPHONE HCL 1 MG/ML IJ SOLN: 1.0000 mg | INTRAMUSCULAR | Status: DC | PRN

## 2019-12-15 MED ORDER — ACETAMINOPHEN 650 MG RE SUPP: 650.0000 mg | RECTAL | Status: DC | PRN

## 2019-12-15 MED ORDER — SODIUM CHLORIDE 0.9 % IV SOLN: 250.0000 mL | INTRAVENOUS | Status: DC

## 2019-12-15 MED ORDER — DEXAMETHASONE SODIUM PHOSPHATE 4 MG/ML IJ SOLN
INTRAMUSCULAR | Status: DC | PRN
  Administered 2019-12-15: 10 mg via INTRAVENOUS

## 2019-12-15 MED ORDER — LACTATED RINGERS IV SOLN: INTRAVENOUS | Status: DC

## 2019-12-15 MED ORDER — MIDAZOLAM HCL 2 MG/2ML IJ SOLN
INTRAMUSCULAR | Status: DC | PRN
  Administered 2019-12-15: 2 mg via INTRAVENOUS

## 2019-12-15 MED ORDER — ACETAMINOPHEN 325 MG PO TABS: 650.0000 mg | ORAL_TABLET | ORAL | Status: DC | PRN

## 2019-12-15 MED ORDER — THROMBIN 20000 UNITS EX SOLR
CUTANEOUS | Status: AC
  Filled 2019-12-15: qty 20000

## 2019-12-15 MED ORDER — ONDANSETRON HCL 4 MG/2ML IJ SOLN
INTRAMUSCULAR | Status: DC | PRN
  Administered 2019-12-15: 4 mg via INTRAVENOUS

## 2019-12-15 MED ORDER — COENZYME Q10 200 MG PO CAPS: 200.0000 mg | ORAL_CAPSULE | Freq: Every day | ORAL | Status: DC

## 2019-12-15 MED ORDER — ONDANSETRON HCL 4 MG/2ML IJ SOLN: 4.0000 mg | Freq: Four times a day (QID) | INTRAMUSCULAR | Status: DC | PRN

## 2019-12-15 MED ORDER — PROPOFOL 10 MG/ML IV BOLUS
INTRAVENOUS | Status: DC | PRN
  Administered 2019-12-15: 20 mg via INTRAVENOUS
  Administered 2019-12-15: 150 mg via INTRAVENOUS

## 2019-12-15 MED ORDER — OXYCODONE HCL 5 MG PO TABS: 5.0000 mg | ORAL_TABLET | Freq: Once | ORAL | Status: AC | PRN

## 2019-12-15 MED ORDER — LORATADINE 10 MG PO TABS
10.0000 mg | ORAL_TABLET | Freq: Every day | ORAL | Status: DC
  Administered 2019-12-15: 10 mg via ORAL
  Filled 2019-12-15: qty 1

## 2019-12-15 MED ORDER — CYCLOBENZAPRINE HCL 10 MG PO TABS
10.0000 mg | ORAL_TABLET | Freq: Three times a day (TID) | ORAL | Status: DC | PRN
  Administered 2019-12-15 (×2): 10 mg via ORAL
  Filled 2019-12-15 (×2): qty 1

## 2019-12-15 MED ORDER — HYDROCODONE-ACETAMINOPHEN 5-325 MG PO TABS
1.0000 | ORAL_TABLET | ORAL | Status: DC | PRN
  Administered 2019-12-15: 1 via ORAL
  Filled 2019-12-15: qty 1

## 2019-12-15 MED ORDER — EPHEDRINE SULFATE-NACL 50-0.9 MG/10ML-% IV SOSY
PREFILLED_SYRINGE | INTRAVENOUS | Status: DC | PRN
  Administered 2019-12-15: 10 mg via INTRAVENOUS

## 2019-12-15 MED ORDER — FENTANYL CITRATE (PF) 100 MCG/2ML IJ SOLN
INTRAMUSCULAR | Status: AC
  Filled 2019-12-15: qty 2

## 2019-12-15 MED ORDER — ONDANSETRON HCL 4 MG/2ML IJ SOLN
INTRAMUSCULAR | Status: AC
  Filled 2019-12-15: qty 2

## 2019-12-15 MED ORDER — CHLORHEXIDINE GLUCONATE 0.12 % MT SOLN
15.0000 mL | Freq: Once | OROMUCOSAL | Status: AC
  Administered 2019-12-15: 15 mL via OROMUCOSAL
  Filled 2019-12-15: qty 15

## 2019-12-15 MED ORDER — SODIUM CHLORIDE 0.9 % IV SOLN
INTRAVENOUS | Status: DC | PRN
  Administered 2019-12-15: 500 mL

## 2019-12-15 MED ORDER — MIDAZOLAM HCL 2 MG/2ML IJ SOLN
INTRAMUSCULAR | Status: AC
  Filled 2019-12-15: qty 2

## 2019-12-15 MED ORDER — ADULT MULTIVITAMIN W/MINERALS CH: 1.0000 | ORAL_TABLET | Freq: Every day | ORAL | Status: DC

## 2019-12-15 MED ORDER — HYDROCODONE-ACETAMINOPHEN 10-325 MG PO TABS
1.0000 | ORAL_TABLET | ORAL | Status: DC | PRN
  Administered 2019-12-15 – 2019-12-16 (×2): 1 via ORAL
  Filled 2019-12-15: qty 2
  Filled 2019-12-15 (×2): qty 1

## 2019-12-15 MED ORDER — ONDANSETRON HCL 4 MG PO TABS: 4.0000 mg | ORAL_TABLET | Freq: Four times a day (QID) | ORAL | Status: DC | PRN

## 2019-12-15 MED ORDER — PHENOL 1.4 % MT LIQD
1.0000 | OROMUCOSAL | Status: DC | PRN
  Administered 2019-12-15: 1 via OROMUCOSAL
  Filled 2019-12-15: qty 177

## 2019-12-15 MED ORDER — DEXAMETHASONE SODIUM PHOSPHATE 10 MG/ML IJ SOLN
INTRAMUSCULAR | Status: AC
  Filled 2019-12-15: qty 1

## 2019-12-15 MED ORDER — CHLORHEXIDINE GLUCONATE CLOTH 2 % EX PADS: 6.0000 | MEDICATED_PAD | Freq: Once | CUTANEOUS | Status: DC

## 2019-12-15 MED ORDER — 0.9 % SODIUM CHLORIDE (POUR BTL) OPTIME
TOPICAL | Status: DC | PRN
  Administered 2019-12-15: 1000 mL

## 2019-12-15 MED ORDER — SODIUM CHLORIDE 0.9% FLUSH
3.0000 mL | Freq: Two times a day (BID) | INTRAVENOUS | Status: DC
  Administered 2019-12-15: 3 mL via INTRAVENOUS

## 2019-12-15 MED ORDER — LIDOCAINE 2% (20 MG/ML) 5 ML SYRINGE
INTRAMUSCULAR | Status: AC
  Filled 2019-12-15: qty 5

## 2019-12-15 MED ORDER — THROMBIN 5000 UNITS EX SOLR
CUTANEOUS | Status: AC
  Filled 2019-12-15: qty 5000

## 2019-12-15 MED ORDER — ORAL CARE MOUTH RINSE: 15.0000 mL | Freq: Once | OROMUCOSAL | Status: AC

## 2019-12-15 MED ORDER — ONDANSETRON HCL 4 MG/2ML IJ SOLN: 4.0000 mg | Freq: Once | INTRAMUSCULAR | Status: DC | PRN

## 2019-12-15 MED ORDER — PHENYLEPHRINE HCL-NACL 10-0.9 MG/250ML-% IV SOLN
INTRAVENOUS | Status: DC | PRN
  Administered 2019-12-15: 20 ug/min via INTRAVENOUS

## 2019-12-15 MED ORDER — CEFAZOLIN SODIUM-DEXTROSE 2-4 GM/100ML-% IV SOLN
2.0000 g | INTRAVENOUS | Status: AC
  Administered 2019-12-15: 2 g via INTRAVENOUS
  Filled 2019-12-15: qty 100

## 2019-12-15 MED ORDER — MENTHOL 3 MG MT LOZG: 1.0000 | LOZENGE | OROMUCOSAL | Status: DC | PRN

## 2019-12-15 MED ORDER — ROCURONIUM BROMIDE 100 MG/10ML IV SOLN
INTRAVENOUS | Status: DC | PRN
  Administered 2019-12-15: 60 mg via INTRAVENOUS

## 2019-12-15 MED ORDER — OXYCODONE HCL 5 MG/5ML PO SOLN
ORAL | Status: AC
  Filled 2019-12-15: qty 5

## 2019-12-15 MED ORDER — OXYCODONE HCL 5 MG/5ML PO SOLN
5.0000 mg | Freq: Once | ORAL | Status: AC | PRN
  Administered 2019-12-15: 5 mg via ORAL

## 2019-12-15 MED ORDER — FENTANYL CITRATE (PF) 100 MCG/2ML IJ SOLN
25.0000 ug | INTRAMUSCULAR | Status: DC | PRN
  Administered 2019-12-15 (×2): 25 ug via INTRAVENOUS

## 2019-12-15 MED ORDER — CEFAZOLIN SODIUM-DEXTROSE 1-4 GM/50ML-% IV SOLN
1.0000 g | Freq: Three times a day (TID) | INTRAVENOUS | Status: AC
  Administered 2019-12-15 (×2): 1 g via INTRAVENOUS
  Filled 2019-12-15 (×2): qty 50

## 2019-12-15 MED ORDER — PHENYLEPHRINE 40 MCG/ML (10ML) SYRINGE FOR IV PUSH (FOR BLOOD PRESSURE SUPPORT)
PREFILLED_SYRINGE | INTRAVENOUS | Status: DC | PRN
  Administered 2019-12-15: 120 ug via INTRAVENOUS

## 2019-12-15 MED ORDER — ATORVASTATIN CALCIUM 10 MG PO TABS
20.0000 mg | ORAL_TABLET | Freq: Every day | ORAL | Status: DC
  Administered 2019-12-15: 20 mg via ORAL
  Filled 2019-12-15: qty 2

## 2019-12-15 MED ORDER — LIDOCAINE HCL (CARDIAC) PF 100 MG/5ML IV SOSY
PREFILLED_SYRINGE | INTRAVENOUS | Status: DC | PRN
  Administered 2019-12-15: 70 mg via INTRAVENOUS
  Administered 2019-12-15: 30 mg via INTRAVENOUS

## 2019-12-15 MED ORDER — SODIUM CHLORIDE 0.9% FLUSH: 3.0000 mL | INTRAVENOUS | Status: DC | PRN

## 2019-12-15 MED ORDER — DEXAMETHASONE SODIUM PHOSPHATE 10 MG/ML IJ SOLN
10.0000 mg | Freq: Once | INTRAMUSCULAR | Status: DC
  Filled 2019-12-15: qty 1

## 2019-12-15 MED ORDER — ROCURONIUM BROMIDE 10 MG/ML (PF) SYRINGE
PREFILLED_SYRINGE | INTRAVENOUS | Status: AC
  Filled 2019-12-15: qty 10

## 2019-12-15 MED ORDER — THROMBIN 20000 UNITS EX SOLR
CUTANEOUS | Status: DC | PRN
  Administered 2019-12-15: 20 mL via TOPICAL

## 2019-12-15 MED ORDER — PROPOFOL 10 MG/ML IV BOLUS
INTRAVENOUS | Status: AC
  Filled 2019-12-15: qty 20

## 2019-12-15 MED ORDER — FENTANYL CITRATE (PF) 100 MCG/2ML IJ SOLN
INTRAMUSCULAR | Status: DC | PRN
  Administered 2019-12-15: 100 ug via INTRAVENOUS
  Administered 2019-12-15: 25 ug via INTRAVENOUS

## 2019-12-15 MED ORDER — FENTANYL CITRATE (PF) 250 MCG/5ML IJ SOLN
INTRAMUSCULAR | Status: AC
  Filled 2019-12-15: qty 5

## 2019-12-15 SURGICAL SUPPLY — 60 items
BAG DECANTER FOR FLEXI CONT (MISCELLANEOUS) ×3 IMPLANT
BAND RUBBER #18 3X1/16 STRL (MISCELLANEOUS) ×6 IMPLANT
BENZOIN TINCTURE PRP APPL 2/3 (GAUZE/BANDAGES/DRESSINGS) ×3 IMPLANT
BIT DRILL 13 (BIT) ×2 IMPLANT
BIT DRILL 13MM (BIT) ×1
BUR MATCHSTICK NEURO 3.0 LAGG (BURR) ×3 IMPLANT
CAGE PEEK 6X14X11 (Cage) ×6 IMPLANT
CAGE PEEK 7X14X11 (Cage) ×2 IMPLANT
CANISTER SUCT 3000ML PPV (MISCELLANEOUS) ×3 IMPLANT
CARTRIDGE OIL MAESTRO DRILL (MISCELLANEOUS) ×1 IMPLANT
CLOSURE WOUND 1/2 X4 (GAUZE/BANDAGES/DRESSINGS) ×1
COVER WAND RF STERILE (DRAPES) ×3 IMPLANT
DIFFUSER DRILL AIR PNEUMATIC (MISCELLANEOUS) ×3 IMPLANT
DRAPE C-ARM 42X72 X-RAY (DRAPES) ×6 IMPLANT
DRAPE LAPAROTOMY 100X72 PEDS (DRAPES) ×3 IMPLANT
DRAPE MICROSCOPE LEICA (MISCELLANEOUS) ×3 IMPLANT
DURAPREP 6ML APPLICATOR 50/CS (WOUND CARE) ×3 IMPLANT
ELECT COATED BLADE 2.86 ST (ELECTRODE) ×3 IMPLANT
ELECT REM PT RETURN 9FT ADLT (ELECTROSURGICAL) ×3
ELECTRODE REM PT RTRN 9FT ADLT (ELECTROSURGICAL) ×1 IMPLANT
GAUZE 4X4 16PLY RFD (DISPOSABLE) IMPLANT
GAUZE SPONGE 4X4 12PLY STRL (GAUZE/BANDAGES/DRESSINGS) ×3 IMPLANT
GLOVE BIO SURGEON STRL SZ 6.5 (GLOVE) ×2 IMPLANT
GLOVE BIO SURGEONS STRL SZ 6.5 (GLOVE) ×1
GLOVE BIOGEL PI IND STRL 6.5 (GLOVE) ×1 IMPLANT
GLOVE BIOGEL PI INDICATOR 6.5 (GLOVE) ×2
GLOVE ECLIPSE 9.0 STRL (GLOVE) ×3 IMPLANT
GLOVE EXAM NITRILE XL STR (GLOVE) IMPLANT
GOWN STRL REUS W/ TWL LRG LVL3 (GOWN DISPOSABLE) IMPLANT
GOWN STRL REUS W/ TWL XL LVL3 (GOWN DISPOSABLE) IMPLANT
GOWN STRL REUS W/TWL 2XL LVL3 (GOWN DISPOSABLE) IMPLANT
GOWN STRL REUS W/TWL LRG LVL3 (GOWN DISPOSABLE)
GOWN STRL REUS W/TWL XL LVL3 (GOWN DISPOSABLE)
HALTER HD/CHIN CERV TRACTION D (MISCELLANEOUS) ×3 IMPLANT
HEMOSTAT POWDER KIT SURGIFOAM (HEMOSTASIS) ×3 IMPLANT
KIT BASIN OR (CUSTOM PROCEDURE TRAY) ×3 IMPLANT
KIT TURNOVER KIT B (KITS) ×3 IMPLANT
NEEDLE SPNL 20GX3.5 QUINCKE YW (NEEDLE) ×3 IMPLANT
NS IRRIG 1000ML POUR BTL (IV SOLUTION) ×3 IMPLANT
OIL CARTRIDGE MAESTRO DRILL (MISCELLANEOUS) ×3
PACK LAMINECTOMY NEURO (CUSTOM PROCEDURE TRAY) ×3 IMPLANT
PAD ARMBOARD 7.5X6 YLW CONV (MISCELLANEOUS) ×9 IMPLANT
PLATE 4 82.5XLCK NS SPNE CVD (Plate) ×1 IMPLANT
PLATE 4 ATLANTIS TRANS (Plate) ×2 IMPLANT
SCREW ST FIX 4 ATL 3120213 (Screw) ×30 IMPLANT
SPACER SPNL 11X14X6XPEEK CVD (Cage) ×3 IMPLANT
SPACER SPNL 11X14X7XPEEK CVD (Cage) ×1 IMPLANT
SPCR SPNL 11X14X6XPEEK CVD (Cage) ×3 IMPLANT
SPCR SPNL 11X14X7XPEEK CVD (Cage) ×1 IMPLANT
SPONGE INTESTINAL PEANUT (DISPOSABLE) ×3 IMPLANT
SPONGE SURGIFOAM ABS GEL 100 (HEMOSTASIS) ×3 IMPLANT
STRIP CLOSURE SKIN 1/2X4 (GAUZE/BANDAGES/DRESSINGS) ×2 IMPLANT
SUT VIC AB 3-0 SH 8-18 (SUTURE) ×3 IMPLANT
SUT VIC AB 4-0 RB1 18 (SUTURE) ×3 IMPLANT
TAPE CLOTH 4X10 WHT NS (GAUZE/BANDAGES/DRESSINGS) ×3 IMPLANT
TAPE PAPER 3X10 WHT MICROPORE (GAUZE/BANDAGES/DRESSINGS) ×3 IMPLANT
TOWEL GREEN STERILE (TOWEL DISPOSABLE) ×3 IMPLANT
TOWEL GREEN STERILE FF (TOWEL DISPOSABLE) ×3 IMPLANT
TRAP SPECIMEN MUCUS 40CC (MISCELLANEOUS) ×3 IMPLANT
WATER STERILE IRR 1000ML POUR (IV SOLUTION) ×3 IMPLANT

## 2019-12-15 NOTE — Progress Notes (Signed)
Gold Wedding band given to patient's wife, Jamesetta So.

## 2019-12-15 NOTE — Evaluation (Signed)
Physical Therapy Evaluation Patient Details Name: Brandon NYLUND Sr. MRN: [id number] DOB: 02-Mar-1946 Today's Date: 12/15/2019   History of Present Illness  74 year old male with right upper extremity weakness with bilateral upper extremity sensory loss and associated neck pain.  Work-up demonstrates evidence of severe multilevel cervical spondylosis with marked stenosis particularly at C4-5.  Patient has marked C5 nerve root dysfunction and severe right shoulder abduction weakness.  Pt s/p c34, c45, C56, C67 ACDF with instrumentation and grafting.  Clinical Impression  Pt is close to baseline functioning and should be safe at home with wife's assist.  Education has been discussed extensively.. There are no further acute PT needs.  Will sign off at this time.     Follow Up Recommendations No PT follow up;Supervision/Assistance - 24 hour    Equipment Recommendations  None recommended by PT    Recommendations for Other Services       Precautions / Restrictions Precautions Precautions: Cervical Precaution Booklet Issued: Yes (comment) Required Braces or Orthoses: Cervical Brace Cervical Brace: Soft collar Restrictions Weight Bearing Restrictions: No      Mobility  Bed Mobility Overal bed mobility: Needs Assistance Bed Mobility: Rolling;Sidelying to Sit;Sit to Sidelying Rolling: Min guard Sidelying to sit: Min guard     Sit to sidelying: Min guard General bed mobility comments: practiced safe technique  Transfers Overall transfer level: Needs assistance   Transfers: Sit to/from Stand Sit to Stand: Supervision            Ambulation/Gait Ambulation/Gait assistance: Supervision Gait Distance (Feet): 300 Feet Assistive device: None;IV Pole Gait Pattern/deviations: Step-through pattern Gait velocity: slower Gait velocity interpretation: <1.8 ft/sec, indicate of risk for recurrent falls General Gait Details: shorter guarded steps  Stairs Stairs: Yes   Stair  Management: One rail Right;Alternating pattern;Forwards Number of Stairs: 4 General stair comments: safe with the rail  Wheelchair Mobility    Modified Rankin (Stroke Patients Only)       Balance Overall balance assessment: Needs assistance Sitting-balance support: No upper extremity supported Sitting balance-Leahy Scale: Fair       Standing balance-Leahy Scale: Fair                               Pertinent Vitals/Pain Pain Assessment: Faces Faces Pain Scale: Hurts little more Pain Location: cervical pain anterior incision Pain Descriptors / Indicators: Aching;Grimacing;Guarding Pain Intervention(s): Monitored during session    Home Living Family/patient expects to be discharged to:: Private residence Living Arrangements: Spouse/significant other Available Help at Discharge: Family Type of Home: House Home Access: Stairs to enter     Home Layout: One level        Prior Function Level of Independence: Independent               Hand Dominance        Extremity/Trunk Assessment   Upper Extremity Assessment Upper Extremity Assessment: Defer to OT evaluation    Lower Extremity Assessment Lower Extremity Assessment: Generalized weakness    Cervical / Trunk Assessment Cervical / Trunk Assessment: Other exceptions(surgical)  Communication   Communication: No difficulties  Cognition Arousal/Alertness: Awake/alert Behavior During Therapy: WFL for tasks assessed/performed Overall Cognitive Status: Within Functional Limits for tasks assessed                                        General Comments General  comments (skin integrity, edema, etc.): pt and wife instructed in cervical prec, log roll, transitions, brace issues, lifting restrictions and progression of activity    Exercises     Assessment/Plan    PT Assessment Patent does not need any further PT services  PT Problem List Decreased activity tolerance;Decreased  mobility;Decreased knowledge of precautions       PT Treatment Interventions      PT Goals (Current goals can be found in the Care Plan section)  Acute Rehab PT Goals Patient Stated Goal: get home PT Goal Formulation: All assessment and education complete, DC therapy    Frequency     Barriers to discharge        Co-evaluation               AM-PAC PT "6 Clicks" Mobility  Outcome Measure Help needed turning from your back to your side while in a flat bed without using bedrails?: A Little Help needed moving from lying on your back to sitting on the side of a flat bed without using bedrails?: A Little Help needed moving to and from a bed to a chair (including a wheelchair)?: A Little Help needed standing up from a chair using your arms (e.g., wheelchair or bedside chair)?: A Little Help needed to walk in hospital room?: None Help needed climbing 3-5 steps with a railing? : None 6 Click Score: 20    End of Session Equipment Utilized During Treatment: Cervical collar Activity Tolerance: Patient tolerated treatment well Patient left: in bed;with call bell/phone within reach;with family/visitor present Nurse Communication: Mobility status PT Visit Diagnosis: Other abnormalities of gait and mobility (R26.89);Pain Pain - part of body: (ant neck)    Time: 6767-2094 PT Time Calculation (min) (ACUTE ONLY): 24 min   Charges:   PT Evaluation $PT Eval Moderate Complexity: 1 Mod PT Treatments $Gait Training: 8-22 mins        12/15/2019  Ginger Carne., PT Acute Rehabilitation Services [phone]  (pager) [phone]  (office)  Brandon Zamora 12/15/2019, 3:21 PM

## 2019-12-15 NOTE — Anesthesia Procedure Notes (Addendum)
Procedure Name: Intubation Date/Time: 12/15/2019 8:12 AM Performed by: Drema Pry, CRNA Pre-anesthesia Checklist: Patient identified, Emergency Drugs available, Suction available and Patient being monitored Patient Re-evaluated:Patient Re-evaluated prior to induction Oxygen Delivery Method: Circle system utilized Preoxygenation: Pre-oxygenation with 100% oxygen Induction Type: IV induction Ventilation: Mask ventilation without difficulty Laryngoscope Size: Glidescope and 4 Grade View: Grade I Tube type: Oral Tube size: 7.5 mm Number of attempts: 1 Airway Equipment and Method: Stylet and Oral airway Placement Confirmation: ETT inserted through vocal cords under direct vision,  positive ETCO2 and breath sounds checked- equal and bilateral Secured at: 21 cm Tube secured with: Tape Dental Injury: Teeth and Oropharynx as per pre-operative assessment  Comments: 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 Intubation performed by Verlee Rossetti.

## 2019-12-15 NOTE — Progress Notes (Signed)
Orthopedic Tech Progress Note Patient Details:  Brandon COON Sr. [birth year] [id number]  Ortho Devices Type of Ortho Device: Soft collar Ortho Device/Splint Location: NECK Ortho Device/Splint Interventions: Ordered   Post Interventions Patient Tolerated: Well Instructions Provided: Care of device   Donald Pore 12/15/2019, 1:44 PM

## 2019-12-15 NOTE — Transfer of Care (Signed)
Immediate Anesthesia Transfer of Care Note  Patient: Brandon LUCZAK Sr.  Procedure(s) Performed: Anterior Cervical Discectomy Fusion - Cervical Three-Cervical Four - Cervical Four-Cervical Five - Cervical Five-Cervical Six - Cervical Six-Cervical Seven (N/A )  Patient Location: PACU  Anesthesia Type:General  Level of Consciousness: awake, alert , patient cooperative and responds to stimulation  Airway & Oxygen Therapy: Patient Spontanous Breathing  Post-op Assessment: Report given to RN and Post -op Vital signs reviewed and stable  Post vital signs: Reviewed and stable  Last Vitals:  Vitals Value Taken Time  BP 136/85 12/15/19 1105  Temp    Pulse 72 12/15/19 1106  Resp 14 12/15/19 1106  SpO2 95 % 12/15/19 1106  Vitals shown include unvalidated device data.  Last Pain:  Vitals:   12/15/19 0700  PainSc: 1       Patients Stated Pain Goal: 2 (12/15/19 0700)  Complications: No apparent anesthesia complications

## 2019-12-15 NOTE — Anesthesia Postprocedure Evaluation (Signed)
Anesthesia Post Note  Patient: Brandon OLARTE Sr.  Procedure(s) Performed: Anterior Cervical Discectomy Fusion - Cervical Three-Cervical Four - Cervical Four-Cervical Five - Cervical Five-Cervical Six - Cervical Six-Cervical Seven (N/A )     Patient location during evaluation: PACU Anesthesia Type: General Level of consciousness: awake and alert Pain management: pain level controlled Vital Signs Assessment: post-procedure vital signs reviewed and stable Respiratory status: spontaneous breathing, nonlabored ventilation, respiratory function stable and patient connected to nasal cannula oxygen Cardiovascular status: blood pressure returned to baseline and stable Postop Assessment: no apparent nausea or vomiting Anesthetic complications: no    Last Vitals:  Vitals:   12/15/19 1317 12/15/19 1643  BP: (!) 159/86 (!) 159/87  Pulse: 73 79  Resp: 18 19  Temp: 36.9 C 37.2 C  SpO2: 99% 99%    Last Pain:  Vitals:   12/15/19 1643  TempSrc: Oral  PainSc:                  Dishon Kehoe COKER

## 2019-12-15 NOTE — Op Note (Signed)
Date of procedure: 12/15/2019  Date of dictation: Same  Service: Neurosurgery  Preoperative diagnosis: Cervical stenosis with myelopathy and radiculopathy  Postoperative diagnosis: Same  Procedure Name: C3-4, C4-5, C5-6, C6-7 anterior cervical discectomy with interbody fusion utilizing interbody peek cages and locally harvested autograft with anterior plate instrumentation  Surgeon:Kimberli Winne A.Mickayla Trouten, M.D.  Asst. Surgeon: Doran Durand, NP  Anesthesia: General  Indication: 74 year old male with severe right upper extremity proximal weakness involving his deltoid, infraspinatus and supraspinatus muscles consistent with a profound right C5 radiculopathy.  Patient also with some patchy distal sensory loss and loss of dexterity in both upper extremities.  Work-up demonstrates evidence of multilevel cervical spondylosis with stenosis and severe foraminal stenosis.  Patient presents now for 4 level anterior cervical decompression and fusion in hopes of improving his symptoms.  Operative note: After induction of anesthesia, patient positioned supine with neck slightly extended and held placed halter traction.  Patient's anterior cervical region prepped and draped sterilely.  Incision made overlying C5-6.  Dissection performed on the right.  Retractor placed.  Fluoroscopy used.  Levels confirmed.  The spaces at C3-4, C4-5, C5-6 and C6-7 were all incised and discectomies were then performed using various instruments down to the level of the posterior annulus.  Microscope was then brought to field used throughout the remainder of the discectomies.  Remaining aspects annulus and osteophytes removed using high-speed drill down to level of the posterior logical ligament.  Posterior logical was then elevated and resected piecemeal fashion underlying thecal sac was then identified.  Wide central decompression was then performed undercutting the bodies of C3 and C4.  Decompression then proceeded to each neural foramina.   Wide anterior foraminotomies were performed on the course exiting C4 nerve roots bilaterally.  At this point a very thorough decompression been achieved.  There was no evidence of injury to thecal sac or nerve roots.  Procedures then repeated at C4-5, C5-6 and C67.  Wound is irrigated with antibiotic solution.  Gelfoam was placed topically for hemostasis then removed.  [employer] anatomic peek cages packed with locally harvested autograft were then impacted into place at all 4 levels.  Each cage was recessed slightly from the anterior cortical margin.  [employer]onic Atlantis translational plate was then placed over the C3-C7 levels.  This then attached under fluoroscopic guidance using 13 mm fixed angle screws to each at all 5 levels.  All screws given final tightening found to be solidly within the bone.  Locking screws engaged all levels.  Final images reveal good position of cages and the hardware at the proper upper level with normal alignment of spine.  Wound is then irrigated one final time.  Hemostasis was assured with biological artery.  Wound is then closed in layers.  Steri-Strips and sterile dressing were applied.  No apparent complications.  Patient tolerated the procedure well and he returns to recovery and postop.

## 2019-12-15 NOTE — Brief Op Note (Signed)
12/15/2019  10:56 AM  PATIENT:  Brandon Cones Sr.  74 y.o. male  PRE-OPERATIVE DIAGNOSIS:  Stenosis  POST-OPERATIVE DIAGNOSIS:  Stenosis  PROCEDURE:  Procedure(s) with comments: Anterior Cervical Discectomy Fusion - Cervical Three-Cervical Four - Cervical Four-Cervical Five - Cervical Five-Cervical Six - Cervical Six-Cervical Seven (N/A) - Anterior Cervical Discectomy Fusion - Cervical Three-Cervical Four - Cervical Four-Cervical Five - Cervical Five-Cervical Six - Cervical Six-Cervical Seven  SURGEON:  Surgeon(s) and Role:    Julio Sicks, MD - Primary  PHYSICIAN ASSISTANT:   ASSISTANTSMarland Mcalpine   ANESTHESIA:   general  EBL:  100cc  BLOOD ADMINISTERED:none  DRAINS: none   LOCAL MEDICATIONS USED:  MARCAINE     SPECIMEN:  No Specimen  DISPOSITION OF SPECIMEN:  N/A  COUNTS:  YES  TOURNIQUET:  * No tourniquets in log *  DICTATION: .Dragon Dictation  PLAN OF CARE: Admit for overnight observation  PATIENT DISPOSITION:  PACU - hemodynamically stable.   Delay start of Pharmacological VTE agent (>24hrs) due to surgical blood loss or risk of bleeding: yes

## 2019-12-15 NOTE — H&P (Signed)
Brandon Cones Sr. is an 74 y.o. male.   Chief Complaint: Weakness HPI: 74 year old male with right upper extremity weakness with bilateral upper extremity sensory loss with associated neck pain.  Work-up demonstrates evidence of severe multilevel cervical spondylosis with marked stenosis particularly at C4-5.  Patient has marked C5 nerve root dysfunction and severe right shoulder abduction weakness.  He is failed conservative management presents now for 4 level anterior cervical decompression and fusion in hopes of improving his symptoms.  Past Medical History:  Diagnosis Date  . Degenerative joint disease   . Hemorrhoids   . Hypercholesteremia   . Neutropenia (HCC)   . Osteomyelitis hip (HCC)   . Thyroid nodule    bilateral  . Transient blindness of both eyes     Past Surgical History:  Procedure Laterality Date  . COLONOSCOPY    . TONSILLECTOMY    . TOTAL HIP ARTHROPLASTY Bilateral   . WISDOM TOOTH EXTRACTION      Family History  Problem Relation Age of Onset  . Dementia Mother   . Heart disease Father    Social History:  reports that he has never smoked. He has never used smokeless tobacco. He reports current alcohol use. He reports that he does not use drugs.  Allergies: No Known Allergies  Medications Prior to Admission  Medication Sig Dispense Refill  . aspirin 81 MG EC tablet Take 81 mg by mouth daily.     Marland Kitchen atorvastatin (LIPITOR) 20 MG tablet Take 1 tablet (20 mg total) by mouth daily. 90 tablet 3  . cetirizine (ZYRTEC) 10 MG tablet Take 10 mg by mouth daily.     . Coenzyme Q10 200 MG capsule Take 200 mg by mouth daily.    . Multiple Vitamin (MULTIVITAMIN) tablet Take 1 tablet by mouth daily.    . Triamcinolone Acetonide (TRIAMCINOLONE 0.1 % CREAM : EUCERIN) CREA Apply 1 application topically daily as needed for irritation.       Results for orders placed or performed during the hospital encounter of 12/14/19 (from the past 48 hour(s))  SARS CORONAVIRUS 2  (TAT 6-24 HRS) Nasopharyngeal Nasopharyngeal Swab     Status: None   Collection Time: 12/14/19 11:04 AM   Specimen: Nasopharyngeal Swab  Result Value Ref Range   SARS Coronavirus 2 NEGATIVE NEGATIVE    Comment: (NOTE) SARS-CoV-2 target nucleic acids are NOT DETECTED. The SARS-CoV-2 RNA is generally detectable in upper and lower respiratory specimens during the acute phase of infection. Negative results do not preclude SARS-CoV-2 infection, do not rule out co-infections with other pathogens, and should not be used as the sole basis for treatment or other patient management decisions. Negative results must be combined with clinical observations, patient history, and epidemiological information. The expected result is Negative. Fact Sheet for Patients: HairSlick.no Fact Sheet for Healthcare Providers: quierodirigir.com This test is not yet approved or cleared by the [place] FDA and  has been authorized for detection and/or diagnosis of SARS-CoV-2 by FDA under an Emergency Use Authorization (EUA). This EUA will remain  in effect (meaning this test can be used) for the duration of the COVID-19 declaration under Section 56 4(b)(1) of the Act, 21 U.S.C. section 360bbb-3(b)(1), unless the authorization is terminated or revoked sooner. Performed at [facility], [address], [place], [place] [address]    No results found.  Pertinent items noted in HPI and remainder of comprehensive ROS otherwise negative.  Blood pressure (!) 163/94, pulse 65, temperature 98.4 F (36.9  C), resp. rate 17, height 5\' 10"  (1.778 m), weight 72.6 kg, SpO2 96 %.  Patient is awake and alert.  He is oriented and appropriate.  His speech is fluent.  His judgment insight are intact.  His cranial nerve function is normal bilateral.  Motor examination extremities reveals 2/5 strength in his right supraspinatus, infraspinatus and deltoid muscle groups.   He has 4-/5 right biceps strength.  Remainder is motor strength intact.  Sensory examination with decrease sensation pinprick light touch in his right C5 dermatome and distally in a patchy nondermatomal fashion in both distal upper extremities.  Reflexes are brisk.  He has Hoffmann's responses in his hands.  Gait is mildly spastic.  Examination head ears eyes nose throat is there are.  Chest and abdomen are benign.  Extremities are free from injury or deformity. Assessment/Plan Cervical stenosis with myelopathy and severe right C5 radiculopathy.  Plan C3-4, C4-5, C5-6, C6-7 anterior cervical discectomies with interbody fusion utilizing interbody cages, local harvested autograft, and anterior plate instrumentation.  Risks and benefits been explained.  Patient wishes to proceed.  Brandon Zamora 12/15/2019, 7:53 AM

## 2019-12-16 DIAGNOSIS — M4712 Other spondylosis with myelopathy, cervical region: Secondary | ICD-10-CM | POA: Diagnosis not present

## 2019-12-16 MED ORDER — ASPIRIN 81 MG PO TBEC: 81.0000 mg | DELAYED_RELEASE_TABLET | Freq: Every day | ORAL | 12 refills | Status: AC

## 2019-12-16 MED ORDER — CYCLOBENZAPRINE HCL 10 MG PO TABS: 10.0000 mg | ORAL_TABLET | Freq: Three times a day (TID) | ORAL | 0 refills | Status: DC | PRN

## 2019-12-16 MED ORDER — HYDROCODONE-ACETAMINOPHEN 5-325 MG PO TABS: 1.0000 | ORAL_TABLET | ORAL | 0 refills | Status: DC | PRN

## 2019-12-16 NOTE — Evaluation (Signed)
Occupational Therapy Evaluation Patient Details Name: Brandon DEEG Sr. MRN: [id number] DOB: Feb 20, 1946 Today's Date: 12/16/2019    History of Present Illness 74 year old male with right upper extremity weakness with bilateral upper extremity sensory loss and associated neck pain.  Work-up demonstrates evidence of severe multilevel cervical spondylosis with marked stenosis particularly at C4-5.  Patient has marked C5 nerve root dysfunction and severe right shoulder abduction weakness.  Pt s/p c34, c45, C56, C67 ACDF with instrumentation and grafting.   Clinical Impression   PTA patient admitted for above and limited by problem list below, including pain and decreased functional use of R shoulder (but improving post op). Pt reports sensation in BUEs WFL and improved post op, remains limited to 90* FF of R UE but improving ROM. Patient completing LB ADls with min assist, UB ADLs with supervision, and transfers with modified independence.  Patient educated on brace mgmt and wear schedule, ADL compensatory techniques, recommendations, DME and safety. He reports his spouse will assist with LB ADls as needed.  Based on performance today, no further OT needs identified acutely. Educated on requesting OP OT at follow up appointment if still having difficulties with R UE ROM and pain.  Pt verbalized understanding, no further questions or concerns. OT will sign off. Thank you for this referral!     Follow Up Recommendations  No OT follow up    Equipment Recommendations  None recommended by OT    Recommendations for Other Services       Precautions / Restrictions Precautions Precautions: Cervical Precaution Booklet Issued: Yes (comment) Precaution Comments: reviewed with pt, good adherence Required Braces or Orthoses: Cervical Brace Cervical Brace: Soft collar Restrictions Weight Bearing Restrictions: No      Mobility Bed Mobility               General bed mobility comments: OOB upon  entry   Transfers Overall transfer level: Modified independent   Transfers: Sit to/from Stand Sit to Stand: Modified independent (Device/Increase time)              Balance Overall balance assessment: Needs assistance Sitting-balance support: No upper extremity supported;Feet supported Sitting balance-Leahy Scale: Good     Standing balance support: No upper extremity supported;During functional activity Standing balance-Leahy Scale: Good                             ADL either performed or assessed with clinical judgement   ADL Overall ADL's : Needs assistance/impaired     Grooming: Modified independent;Standing   Upper Body Bathing: Supervision/ safety;Set up;Sitting;Standing   Lower Body Bathing: Sit to/from stand;Minimal assistance Lower Body Bathing Details (indicate cue type and reason): difficutly reaching feet Upper Body Dressing : Supervision/safety;Set up;Sitting   Lower Body Dressing: Minimal assistance;Sit to/from stand Lower Body Dressing Details (indicate cue type and reason): requires assist for socks Toilet Transfer: Modified Independent;Ambulation           Functional mobility during ADLs: Modified independent General ADL Comments: pt educated on cervical precautions, ADl compensatory techniques, recommendations and safety; spouse plans to assist with LB ADls as needed     Vision   Vision Assessment?: No apparent visual deficits     Perception     Praxis      Pertinent Vitals/Pain Pain Assessment: Faces Faces Pain Scale: Hurts little more Pain Location: R scapular area  Pain Descriptors / Indicators: Discomfort;Grimacing Pain Intervention(s): Limited activity within patient's tolerance;Monitored during session;Repositioned  Hand Dominance Right   Extremity/Trunk Assessment Upper Extremity Assessment Upper Extremity Assessment: RUE deficits/detail RUE Deficits / Details: limited shoulder flexion to 90, but improving  strength and sensation compared to pre-op  RUE Sensation: WNL RUE Coordination: WNL   Lower Extremity Assessment Lower Extremity Assessment: Defer to PT evaluation   Cervical / Trunk Assessment Cervical / Trunk Assessment: Other exceptions Cervical / Trunk Exceptions: s/p cervical sx   Communication Communication Communication: Other (comment)(soft spoken)   Cognition Arousal/Alertness: Awake/alert Behavior During Therapy: WFL for tasks assessed/performed Overall Cognitive Status: Within Functional Limits for tasks assessed                                     General Comments  discussed activity modification, brace mgmt, and ADL compensatory techniques    Exercises     Shoulder Instructions      Home Living Family/patient expects to be discharged to:: Private residence Living Arrangements: Spouse/significant other Available Help at Discharge: Family Type of Home: House Home Access: Stairs to enter     Home Layout: One level     Bathroom Shower/Tub: Occupational psychologist: Standard     Home Equipment: Other (comment)(toilet frame )          Prior Functioning/Environment Level of Independence: Independent        Comments: driving        OT Problem List: Decreased strength;Decreased range of motion;Decreased coordination;Pain;Impaired UE functional use      OT Treatment/Interventions:      OT Goals(Current goals can be found in the care plan section) Acute Rehab OT Goals Patient Stated Goal: get home OT Goal Formulation: With patient  OT Frequency:     Barriers to D/C:            Co-evaluation              AM-PAC OT "6 Clicks" Daily Activity     Outcome Measure Help from another person eating meals?: None Help from another person taking care of personal grooming?: None Help from another person toileting, which includes using toliet, bedpan, or urinal?: None Help from another person bathing (including washing,  rinsing, drying)?: A Little Help from another person to put on and taking off regular upper body clothing?: None Help from another person to put on and taking off regular lower body clothing?: A Little 6 Click Score: 22   End of Session Equipment Utilized During Treatment: Cervical collar Nurse Communication: Mobility status  Activity Tolerance: Patient tolerated treatment well Patient left: in chair;with call bell/phone within reach  OT Visit Diagnosis: Muscle weakness (generalized) (M62.81);Pain Pain - Right/Left: Right Pain - part of body: Shoulder(neck- incisional)                Time: 5400-8676 OT Time Calculation (min): 17 min Charges:  OT General Charges $OT Visit: 1 Visit OT Evaluation $OT Eval Low Complexity: 1 Low  Jolaine Artist, OT Acute Rehabilitation Services Pager [phone] Office [phone]    Delight Stare 12/16/2019, 8:47 AM

## 2019-12-16 NOTE — Progress Notes (Signed)
Patient is discharged from room 3C06 at this time. Alert and in stable condition. IV site d/c'd and instructions read to patient and spouse with understanding verbalized and all questions answered. Left unit via wheelchair with all belongings at side 

## 2019-12-16 NOTE — Discharge Summary (Signed)
Physician Discharge Summary  Patient ID: Brandon CHEWNING Sr. MRN: [id number] DOB/AGE: [birth year] 72 y.o.  Admit date: 12/15/2019 Discharge date: 12/16/2019  Admission Diagnoses:  Cervical myelopathy & radiculopathy  Discharge Diagnoses:  Same Active Problems:   Cervical spondylosis with myelopathy and radiculopathy   Discharged Condition: Stable  Hospital Course:  Brandon TURVEY Sr. is a 74 y.o. male who was admitted for the below procedure. There were no post operative complications. At time of discharge, pain was well controlled, ambulating with Pt/OT, tolerating po, voiding normal. Ready for discharge.  Treatments: Surgery C3-4, C4-5, C5-6, C6-7 anterior cervical discectomy with interbody fusion utilizing interbody peek cages and locally harvested autograft with anterior plate instrumentation   Discharge Exam: Blood pressure 131/78, pulse 83, temperature 98.6 F (37 C), temperature source Oral, resp. rate 18, height 5\' 10"  (1.778 m), weight 72.6 kg, SpO2 97 %. Awake, alert, oriented Speech fluent, appropriate CN grossly intact MAEW Wound c/d/i  Disposition: Discharge disposition: 01-Home or Self Care       Discharge Instructions    Call MD for:  difficulty breathing, headache or visual disturbances   Complete by: As directed    Call MD for:  persistant dizziness or light-headedness   Complete by: As directed    Call MD for:  redness, tenderness, or signs of infection (pain, swelling, redness, odor or green/yellow discharge around incision site)   Complete by: As directed    Call MD for:  severe uncontrolled pain   Complete by: As directed    Call MD for:  temperature >100.4   Complete by: As directed    Diet general   Complete by: As directed    Driving Restrictions   Complete by: As directed    Do not drive until given clearance.   Increase activity slowly   Complete by: As directed    Lifting restrictions   Complete by: As directed    Do not lift  anything >10lbs. Avoid bending and twisting in awkward positions. Avoid bending at the back.   May shower / Bathe   Complete by: As directed    In 24 hours. Okay to wash wound with warm soapy water. Avoid scrubbing the wound. Pat dry.   Remove dressing in 24 hours   Complete by: As directed      Allergies as of 12/16/2019   No Known Allergies     Medication List    TAKE these medications   aspirin 81 MG EC tablet Take 1 tablet (81 mg total) by mouth daily. Start taking on: December 22, 2019 What changed: These instructions start on December 22, 2019. If you are unsure what to do until then, ask your doctor or other care provider.   atorvastatin 20 MG tablet Commonly known as: LIPITOR Take 1 tablet (20 mg total) by mouth daily.   cetirizine 10 MG tablet Commonly known as: ZYRTEC Take 10 mg by mouth daily.   Coenzyme Q10 200 MG capsule Take 200 mg by mouth daily.   cyclobenzaprine 10 MG tablet Commonly known as: FLEXERIL Take 1 tablet (10 mg total) by mouth 3 (three) times daily as needed for muscle spasms.   HYDROcodone-acetaminophen 5-325 MG tablet Commonly known as: NORCO/VICODIN Take 1 tablet by mouth every 4 (four) hours as needed for moderate pain ((score 4 to 6)).   multivitamin tablet Take 1 tablet by mouth daily.   triamcinolone 0.1 % cream : eucerin Crea Apply 1 application topically daily as needed for irritation.  Follow-up Information    Julio Sicks, MD Follow up.   Specialty: Neurosurgery Contact information: [address] [place] [address] [phone]           Signed: Alyson Ingles 12/16/2019, 7:49 AM

## 2019-12-16 NOTE — Discharge Instructions (Signed)

## 2019-12-18 ENCOUNTER — Encounter: Payer: Self-pay | Admitting: *Deleted

## 2020-01-26 ENCOUNTER — Encounter: Payer: Self-pay | Admitting: Internal Medicine

## 2020-01-26 ENCOUNTER — Other Ambulatory Visit: Payer: Self-pay

## 2020-01-26 ENCOUNTER — Ambulatory Visit (INDEPENDENT_AMBULATORY_CARE_PROVIDER_SITE_OTHER): Payer: Medicare Other | Admitting: Internal Medicine

## 2020-01-26 VITALS — BP 142/70 | HR 58 | Ht 70.0 in | Wt 159.4 lb

## 2020-01-26 DIAGNOSIS — E042 Nontoxic multinodular goiter: Secondary | ICD-10-CM | POA: Diagnosis not present

## 2020-01-26 NOTE — Progress Notes (Signed)
Name: Brandon DENZ Sr.  MRN/ DOB: [id number], [birth year]    Age/ Sex: 74 y.o., male     PCP: Theodoro Kos, MD   Reason for Endocrinology Evaluation: MNG     Initial Endocrinology Clinic Visit: 09/07/2019    PATIENT IDENTIFIER: Brandon Zamora. is a 74 y.o., male with a past medical history of Dyslipidemia. He has followed with [facility] since 09/07/2019 for consultative assistance with management of his MNG.   HISTORICAL SUMMARY:  Brandon Zamora presented to [facility] neurology for evaluation of transient loss of vision.  During imaging he was found to have an incidental right thyroid nodule on the CT scan.  This prompted a thyroid ultrasound which showed a right inferior thyroid nodule with a max diameter of 3.1 cm meeting criteria for FNA.  He also was found to have a left inferior thyroid nodule that does not meet criteria for FNA but would require an evaluation in 1 year.  He is as/P FNA of the right inferior nodule 3.1 cm with FL US(Bethesda category III) on 09/19/2019, with benign Afirma results.  SUBJECTIVE:    Today (01/26/2020):  Brandon Zamora is here for a follow up on MNG.  He recently had anterior cervical disc colectomy, his dysphagia is 99% better, he denies any anterior neck pain, he does endorse slight swelling at the incision site. Denies constipation     ROS:  As per HPI.   HISTORY:  Past Medical History:  Past Medical History:  Diagnosis Date  . Degenerative joint disease   . Hemorrhoids   . Hypercholesteremia   . Neutropenia (HCC)   . Osteomyelitis hip (HCC)   . Thyroid nodule    bilateral  . Transient blindness of both eyes     Past Surgical History:  Past Surgical History:  Procedure Laterality Date  . ANTERIOR CERVICAL DECOMPRESSION/DISCECTOMY FUSION 4 LEVELS N/A 12/15/2019   Procedure: Anterior Cervical Discectomy Fusion - Cervical Three-Cervical Four - Cervical Four-Cervical Five - Cervical Five-Cervical Six - Cervical  Six-Cervical Seven;  Surgeon: Julio Sicks, MD;  Location: [facility] OR;  Service: Neurosurgery;  Laterality: N/A;  Anterior Cervical Discectomy Fusion - Cervical Three-Cervical Four - Cervical Four-Cervical Five - Cervical Five-Cervical Six - Cervical Six-Cerv  . COLONOSCOPY    . TONSILLECTOMY    . TOTAL HIP ARTHROPLASTY Bilateral   . WISDOM TOOTH EXTRACTION       Social History:  reports that he has never smoked. He has never used smokeless tobacco. He reports current alcohol use. He reports that he does not use drugs. Family History:  Family History  Problem Relation Age of Onset  . Dementia Mother   . Heart disease Father       HOME MEDICATIONS: Allergies as of 01/26/2020   No Known Allergies     Medication List       Accurate as of January 26, 2020  9:50 AM. If you have any questions, ask your nurse or doctor.        aspirin 81 MG EC tablet Take 1 tablet (81 mg total) by mouth daily.   atorvastatin 20 MG tablet Commonly known as: LIPITOR Take 1 tablet (20 mg total) by mouth daily.   cetirizine 10 MG tablet Commonly known as: ZYRTEC Take 10 mg by mouth daily.   Coenzyme Q10 200 MG capsule Take 200 mg by mouth daily.   cyclobenzaprine 10 MG tablet Commonly known as: FLEXERIL Take 1 tablet (10 mg total) by mouth 3 (  three) times daily as needed for muscle spasms.   HYDROcodone-acetaminophen 5-325 MG tablet Commonly known as: NORCO/VICODIN Take 1 tablet by mouth every 4 (four) hours as needed for moderate pain ((score 4 to 6)).   multivitamin tablet Take 1 tablet by mouth daily.   triamcinolone 0.1 % cream : eucerin Crea Apply 1 application topically daily as needed for irritation.         OBJECTIVE:   PHYSICAL EXAM: VS: BP (!) 142/70 (BP Location: Left Arm, Patient Position: Sitting, Cuff Size: Normal)   Pulse (!) 58   Ht 5\' 10"  (1.778 m)   Wt 159 lb 6.4 oz (72.3 kg)   SpO2 98%   BMI 22.87 kg/m    EXAM: General: Pt appears well and is in NAD  Neck:  General: Supple without adenopathy. Thyroid: Thyroid size normal.  Small right inferior nodule palpated as well as  ~ 0.5 centimeters nodule on the left   Lungs: Clear with good BS bilat with no rales, rhonchi, or wheezes  Heart: Auscultation: RRR.  Abdomen: Normoactive bowel sounds, soft, nontender, without masses or organomegaly palpable  Extremities:  BL LE: No pretibial edema normal ROM and strength.  Mental Status: Judgment, insight: Intact Orientation: Oriented to time, place, and person Mood and affect: No depression, anxiety, or agitation     DATA REVIEWED:  Results for SYLAS, TWOMBLY" (MRN Sharene Butters) as of 01/26/2020 09:51  Ref. Range 09/19/2019 09:06  TSH Latest Ref Range: 0.35 - 4.50 uIU/mL 3.53  T4,Free(Direct) Latest Ref Range: 0.60 - 1.60 ng/dL 11/19/2019     FNA 4.85  Right inferior 3.1cm; Other 2 dimensions: 2.0 x 1.7cm,  Peripheral calcifications, TI-RADS total points 6  Specimen Submitted: A. 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 Multinodular goiter   - Pt is clinically and biochemicaly euthyroid  - NO local neck symptoms  - S/P right inferior FNA with FLUS (Bethesda category III) with benign Afirma -On exam today his right inferior nodule actually felt slightly smaller -We will repeat thyroid ultrasound in 08/2020   Follow-up in 6 months   Signed electronically by: 09/2020, MD  [facility]  [facility] [address] [place], [place] Phone: [phone] FAX: [phone]      CC: [phone], MD [id number] [facility] [facility] [address] [place] [facility] [place] Phone: [phone]  Fax: [phone]   Return to Endocrinology clinic as below: No future appointments.

## 2020-03-06 ENCOUNTER — Ambulatory Visit: Payer: Medicare Other | Admitting: Internal Medicine

## 2020-07-24 ENCOUNTER — Other Ambulatory Visit: Payer: Self-pay | Admitting: Internal Medicine

## 2020-08-02 ENCOUNTER — Ambulatory Visit: Payer: Medicare Other | Admitting: Internal Medicine

## 2020-08-22 ENCOUNTER — Encounter: Payer: Self-pay | Admitting: Internal Medicine

## 2020-08-22 ENCOUNTER — Ambulatory Visit (INDEPENDENT_AMBULATORY_CARE_PROVIDER_SITE_OTHER): Payer: Medicare Other | Admitting: Internal Medicine

## 2020-08-22 ENCOUNTER — Other Ambulatory Visit: Payer: Self-pay

## 2020-08-22 VITALS — BP 128/62 | HR 60 | Ht 70.0 in | Wt 158.5 lb

## 2020-08-22 DIAGNOSIS — E042 Nontoxic multinodular goiter: Secondary | ICD-10-CM | POA: Diagnosis not present

## 2020-08-22 LAB — T4, FREE: Free T4: 0.92 ng/dL (ref 0.60–1.60)

## 2020-08-22 LAB — TSH: TSH: 2.34 u[IU]/mL (ref 0.35–4.50)

## 2020-08-22 NOTE — Progress Notes (Signed)
Name: Brandon KRUPINSKI Sr.  MRN/ DOB: [id number], 05-Jul-1946    Age/ Sex: 75 y.o., male     PCP: Theodoro Kos, MD   Reason for Endocrinology Evaluation: MNG     Initial Endocrinology Clinic Visit: 09/07/2019    PATIENT IDENTIFIER: Brandon Zamora. is a 75 y.o., male with a past medical history of Dyslipidemia. He has followed with [facility] since 09/07/2019 for consultative assistance with management of his MNG.   HISTORICAL SUMMARY:  Brandon Zamora presented to [facility] neurology for evaluation of transient loss of vision.  During imaging he was found to have an incidental right thyroid nodule on the CT scan.  This prompted a thyroid ultrasound which showed a right inferior thyroid nodule with a max diameter of 3.1 cm meeting criteria for FNA.  He also was found to have a left inferior thyroid nodule that does not meet criteria for FNA but would require an evaluation in 1 year.  He is as/P FNA of the right inferior nodule 3.1 cm with FL US(Bethesda category III) on 09/19/2019, with benign Afirma results.  SUBJECTIVE:    Today (08/22/2020):  Brandon Zamora is here for a follow up on MNG.     He denies any anterior neck pain,  He feels weak in the arms but has been improving  Denies constipation  Denies depression  Weight is stable        HISTORY:  Past Medical History:  Past Medical History:  Diagnosis Date  . Degenerative joint disease   . Hemorrhoids   . Hypercholesteremia   . Neutropenia (HCC)   . Osteomyelitis hip (HCC)   . Thyroid nodule    bilateral  . Transient blindness of both eyes    Past Surgical History:  Past Surgical History:  Procedure Laterality Date  . ANTERIOR CERVICAL DECOMPRESSION/DISCECTOMY FUSION 4 LEVELS N/A 12/15/2019   Procedure: Anterior Cervical Discectomy Fusion - Cervical Three-Cervical Four - Cervical Four-Cervical Five - Cervical Five-Cervical Six - Cervical Six-Cervical Seven;  Surgeon: Julio Sicks, MD;   Location: [facility] OR;  Service: Neurosurgery;  Laterality: N/A;  Anterior Cervical Discectomy Fusion - Cervical Three-Cervical Four - Cervical Four-Cervical Five - Cervical Five-Cervical Six - Cervical Six-Cerv  . COLONOSCOPY    . TONSILLECTOMY    . TOTAL HIP ARTHROPLASTY Bilateral   . WISDOM TOOTH EXTRACTION      Social History:  reports that he has never smoked. He has never used smokeless tobacco. He reports current alcohol use. He reports that he does not use drugs. Family History:  Family History  Problem Relation Age of Onset  . Dementia Mother   . Heart disease Father      HOME MEDICATIONS: Allergies as of 08/22/2020   No Known Allergies     Medication List       Accurate as of August 22, 2020  7:33 AM. If you have any questions, ask your nurse or doctor.        aspirin 81 MG EC tablet Take 1 tablet (81 mg total) by mouth daily.   atorvastatin 20 MG tablet Commonly known as: LIPITOR TAKE ONE (1) TABLET EACH DAY   cetirizine 10 MG tablet Commonly known as: ZYRTEC Take 10 mg by mouth daily.   Coenzyme Q10 200 MG capsule Take 200 mg by mouth daily.   cyclobenzaprine 10 MG tablet Commonly known as: FLEXERIL Take 1 tablet (10 mg total) by mouth 3 (three) times daily as needed for muscle spasms.  HYDROcodone-acetaminophen 5-325 MG tablet Commonly known as: NORCO/VICODIN Take 1 tablet by mouth every 4 (four) hours as needed for moderate pain ((score 4 to 6)).   multivitamin tablet Take 1 tablet by mouth daily.   triamcinolone 0.1 % cream : eucerin Crea Apply 1 application topically daily as needed for irritation.         OBJECTIVE:   PHYSICAL EXAM: VS: There were no vitals taken for this visit.   EXAM: General: Pt appears well and is in NAD  Neck: General: Supple without adenopathy. Thyroid: Thyroid size normal.  Small right inferior nodule palpated as well as  ~ 0.5 centimeters nodule on the left   Lungs: Clear with good BS bilat with no rales,  rhonchi, or wheezes  Heart: Auscultation: RRR.  Abdomen: Normoactive bowel sounds, soft, nontender, without masses or organomegaly palpable  Extremities:  BL LE: No pretibial edema normal ROM and strength.  Mental Status: Judgment, insight: Intact Orientation: Oriented to time, place, and person Mood and affect: No depression, anxiety, or agitation     DATA REVIEWED: Results for JERAULD, BOSTWICK" (MRN [id number]) as of 08/22/2020 12:44  Ref. Range 08/22/2020 08:26  TSH Latest Ref Range: 0.35 - 4.50 uIU/mL 2.34  T4,Free(Direct) Latest Ref Range: 0.60 - 1.60 ng/dL 5.09    FNA 09/19/6710  Right inferior 3.1cm; Other 2 dimensions: 2.0 x 1.7cm,  Peripheral calcifications, TI-RADS total points 6  Specimen Submitted: A. 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 Multinodular goiter   - Pt is clinically and biochemicaly euthyroid  - NO local neck symptoms  - S/P right inferior FNA with FLUS (Bethesda category III) with benign Afirma  (09/2019) -We will repeat thyroid ultrasound    Follow-up in 1 yr    Signed electronically by: Lyndle Herrlich, MD  [facility] Endocrinology  [facility] [address] [place] [place], [place] [address] Phone: [phone] FAX: [phone]      CC: Theodoro Kos, MD [id number] [address] [facility] [address] [place] [address] Phone: [phone]  Fax: [phone]   Return to Endocrinology clinic as below: Future Appointments  Date Time Provider Department Center  08/22/2020  8:10 AM Madiline Saffran, Konrad Dolores, MD [facility] None

## 2020-09-03 ENCOUNTER — Ambulatory Visit
Admission: RE | Admit: 2020-09-03 | Discharge: 2020-09-03 | Disposition: A | Discharge status: Home / Self Care | Payer: Medicare Other | Source: Ambulatory Visit | Attending: Internal Medicine | Admitting: Internal Medicine

## 2020-09-03 DIAGNOSIS — E042 Nontoxic multinodular goiter: Secondary | ICD-10-CM

## 2020-10-21 ENCOUNTER — Other Ambulatory Visit: Payer: Self-pay | Admitting: Internal Medicine

## 2021-01-03 IMAGING — CT CT ANGIO HEAD
1 of 4 series · 6 of 30 positions shown · IV contrast (iopamidol)
Comparison: None.

CLINICAL DATA: Two episodes of bilateral blindness. Initial episode
1 year ago. Second episode 1 month ago.

EXAM:
CT ANGIOGRAPHY HEAD AND NECK
TECHNIQUE: Multidetector CT imaging of the head and neck was performed using
the standard protocol during bolus administration of intravenous
contrast. Multiplanar CT image reconstructions and MIPs were
obtained to evaluate the vascular anatomy. Carotid stenosis
measurements (when applicable) are obtained utilizing NASCET
criteria, using the distal internal carotid diameter as the
denominator.
CONTRAST:  75mL EJQF3O-ZJC IOPAMIDOL (EJQF3O-ZJC) INJECTION 76%

[Series 7: head/neck angio · axial · 0.55mm/px · z∈[-323,-77]mm · 6 of 173 slices shown]
[im 25/173  brain]
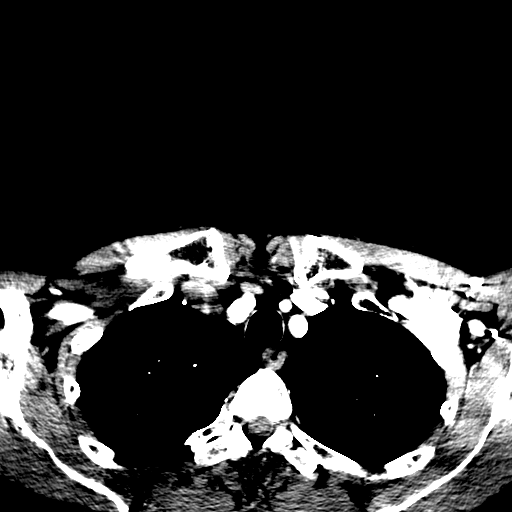
[im 50/173  bone]
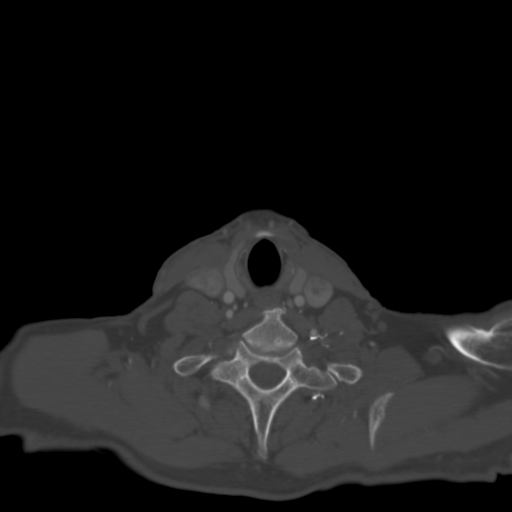
[im 74/173  brain]
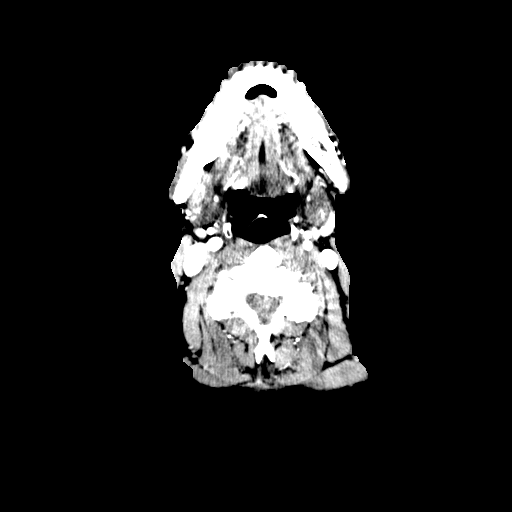
[im 99/173  bone]
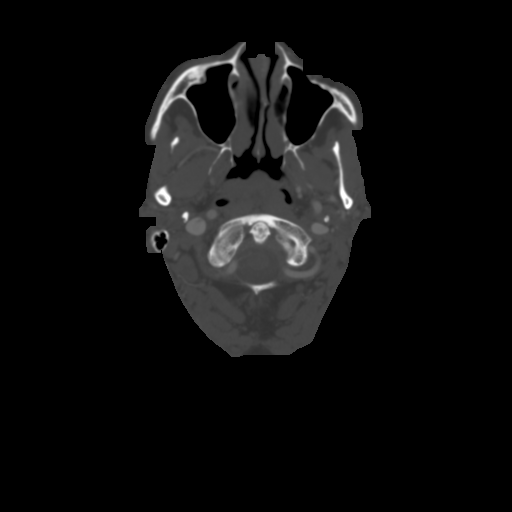
[im 123/173  brain]
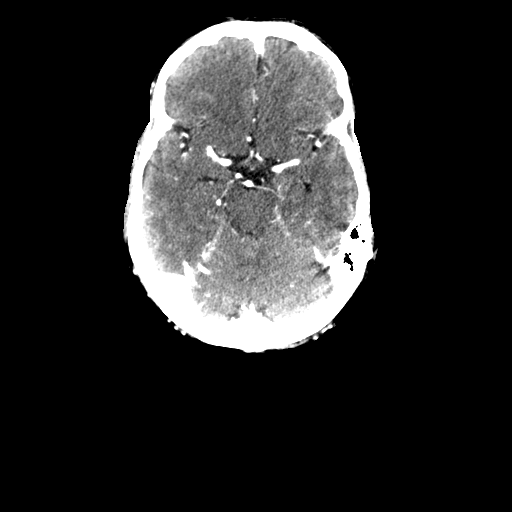
[im 148/173  bone]
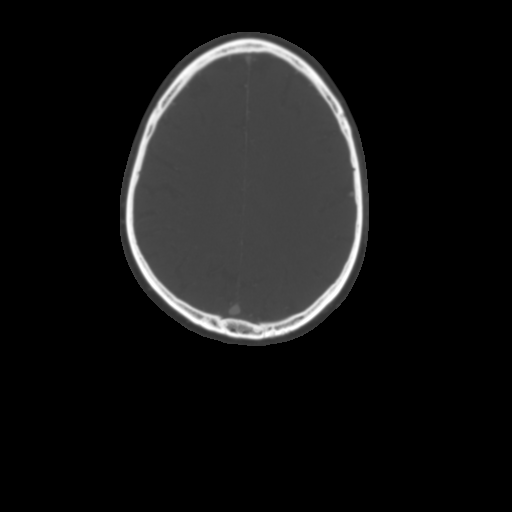

[6 of 30 positions shown; findings below may reference images not displayed]

FINDINGS: CT HEAD FINDINGS

Brain: The brain shows a normal appearance without evidence of
malformation, atrophy, old or acute small or large vessel
infarction, mass lesion, hemorrhage, hydrocephalus or extra-axial
collection.

Vascular: No hyperdense vessel. No evidence of atherosclerotic
calcification.

Skull: Normal.  No traumatic finding.  No focal bone lesion.

Sinuses/Orbits: Sinuses are clear. Orbits appear normal. Mastoids
are clear.

Other: None significant

CTA NECK FINDINGS

Aortic arch: Aortic atherosclerosis. Branching pattern is normal
without origin stenosis.

Right carotid system: Common carotid artery widely patent to the
bifurcation. Calcified plaque at the carotid bifurcation and
proximal ICA bulb. No stenosis when compared to the more distal
cervical ICA diameter.

Left carotid system: Common carotid artery widely patent to the
bifurcation. Mild calcified plaque at the bifurcation and ICA bulb
but no stenosis. Cervical ICA widely patent beyond that.

Vertebral arteries: Both vertebral arteries widely patent at their
origin. The vertebral arteries are approximately equal in size and
both are widely patent through the cervical region to the foramen
magnum.

Skeleton: Ordinary cervical spondylosis and facet osteoarthritis.

Other neck: No mucosal or submucosal mass or lymphadenopathy.
Multiple thyroid nodules. The dominant nodule at the inferior aspect
the right lobe shows areas of calcification. Maximal diameter is
cm.

Upper chest: Normal

Review of the MIP images confirms the above findings

CTA HEAD FINDINGS

Anterior circulation: Both internal carotid arteries are patent
through the skull base and siphon regions. No siphon atherosclerotic
calcification or stenosis. The anterior and middle cerebral vessels
are patent without proximal stenosis, aneurysm or vascular
malformation. No large or medium vessel occlusion.

Posterior circulation: Both vertebral arteries are widely patent
through the foramen magnum to the basilar. No basilar stenosis.
Posterior circulation branch vessels appear patent and normal.

Venous sinuses: Patent and normal.

Anatomic variants: None significant.

Review of the MIP images confirms the above findings
IMPRESSION: Nonstenotic atherosclerotic disease at both carotid bifurcations. No
intracranial anterior circulation large or medium vessel occlusion
or significant distal vessel atherosclerotic irregularity.

No posterior circulation pathology. Both vertebral arteries widely
patent. Posterior circulation branch vessels appear patent and
normal.

Dominant thyroid nodule lower pole on the right with areas of
calcification. Maximal diameter 2.4 cm. Recommend thyroid US (ref: [HOSPITAL]. [DATE]): 143-50).

## 2021-01-09 IMAGING — US US THYROID
1 series · 13 of 25 positions shown · non-contrast
Comparison: CT scan of the head and neck 08/16/2019

CLINICAL DATA: Incidental on CT.

EXAM:
THYROID ULTRASOUND
TECHNIQUE: Ultrasound examination of the thyroid gland and adjacent soft
tissues was performed.

[Series 1: us thyroid · 0.05mm/px · 13 of 66 slices shown]
[im 1/66]
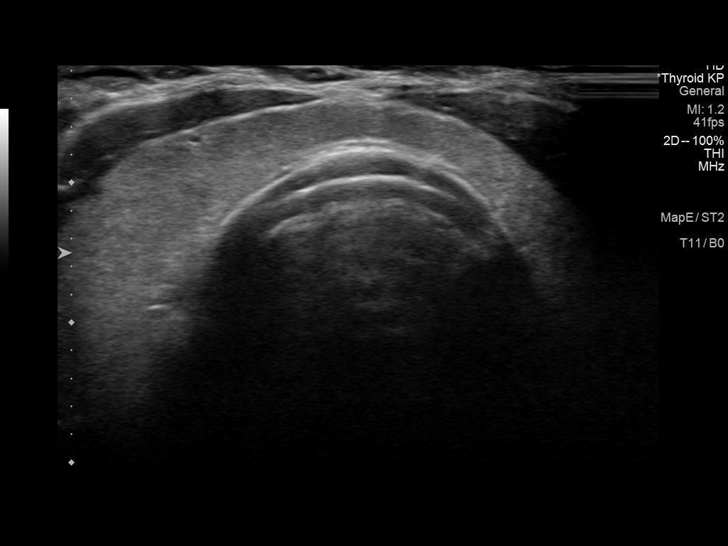
[im 6/66]
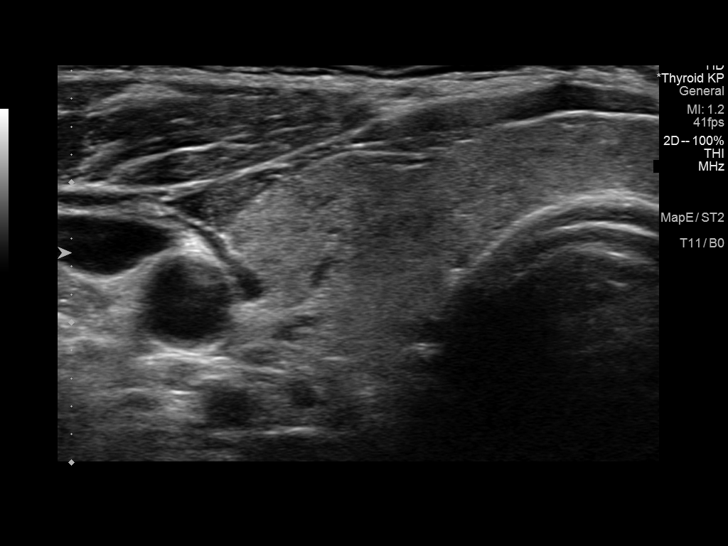
[im 11/66]
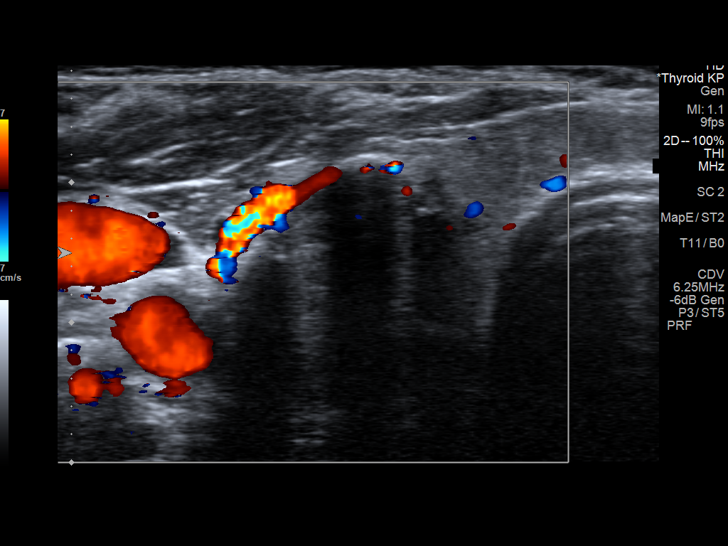
[im 17/66]
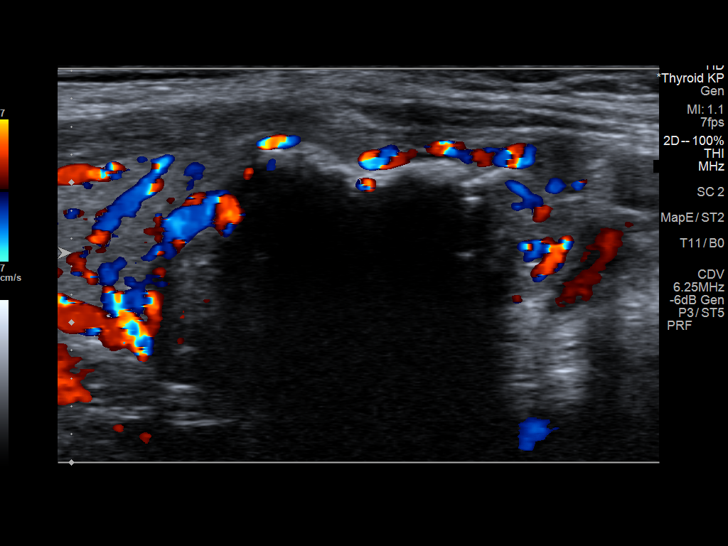
[im 22/66]
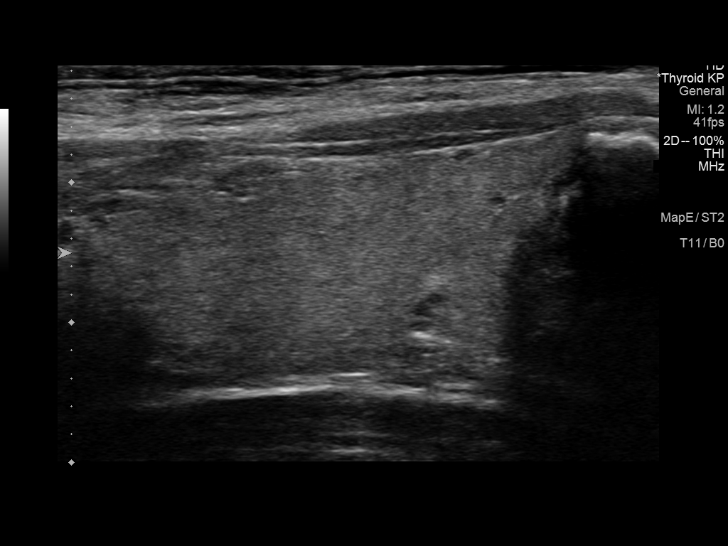
[im 28/66]
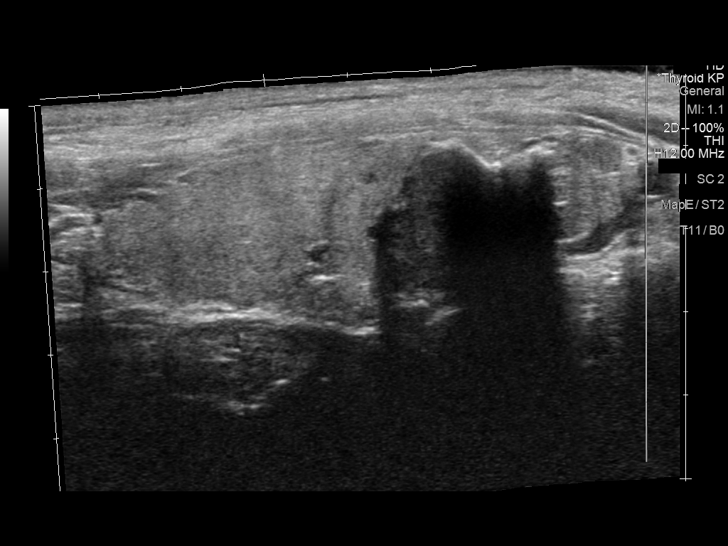
[im 33/66]
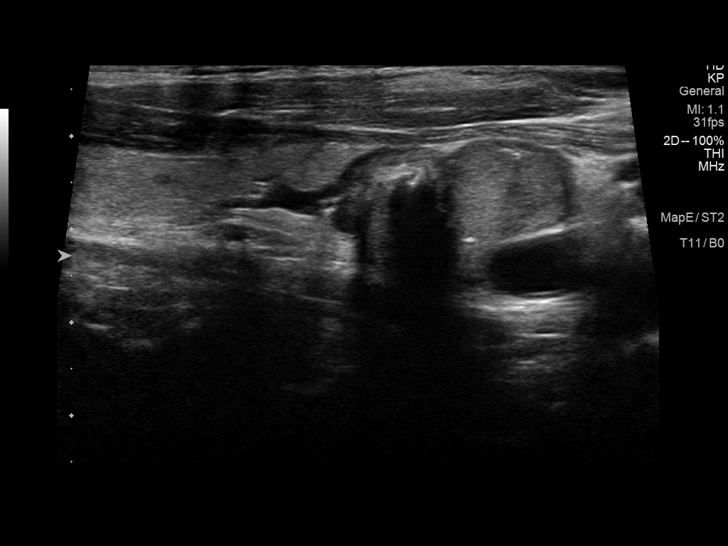
[im 38/66]
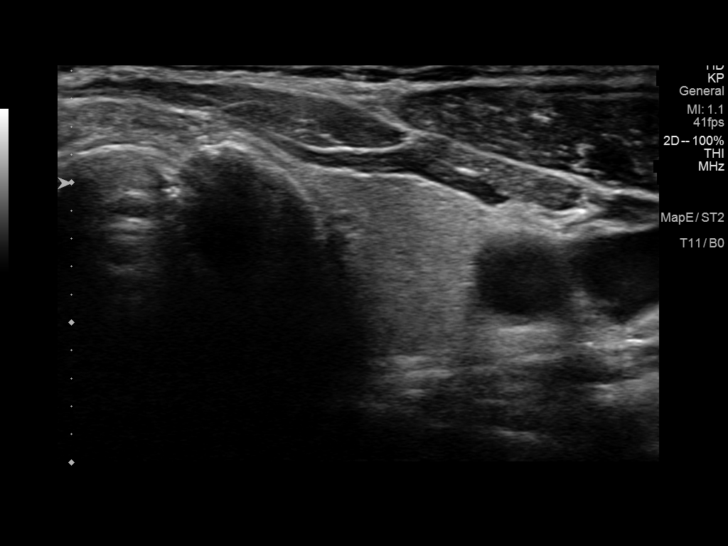
[im 44/66]
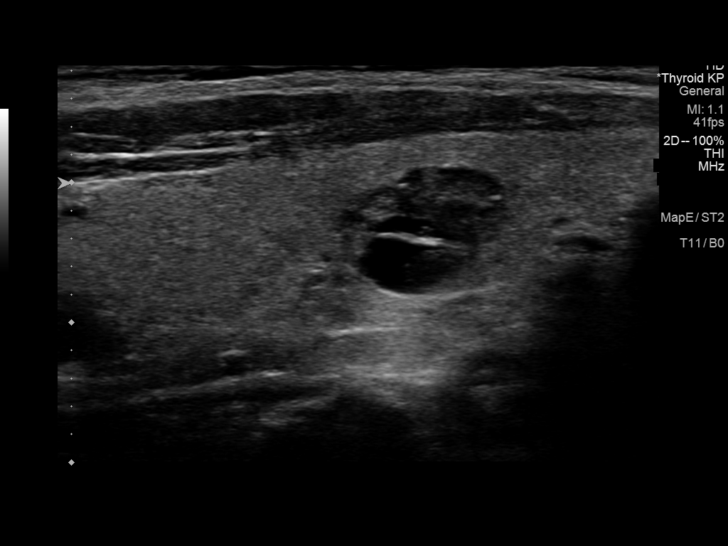
[im 49/66]
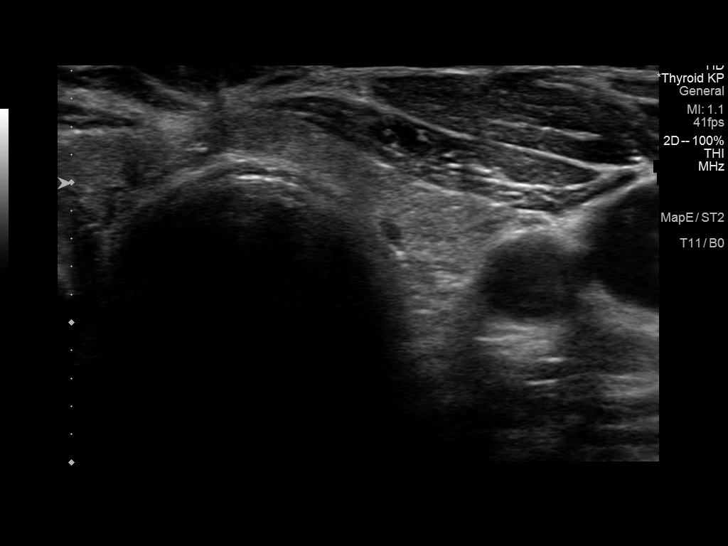
[im 55/66]
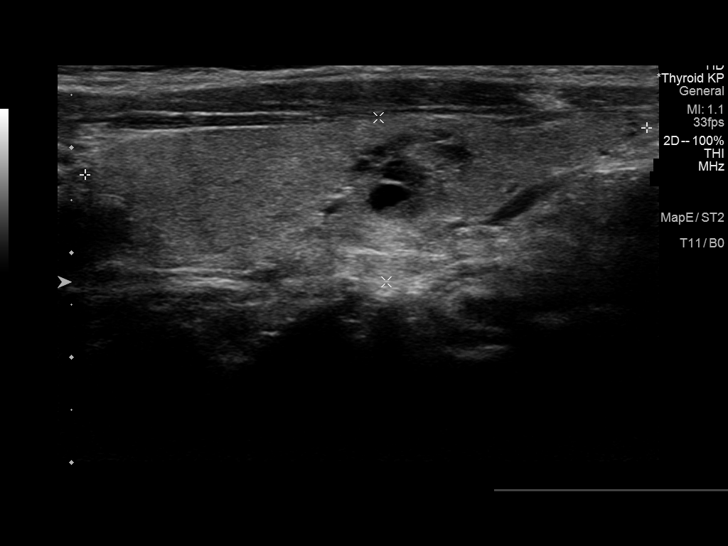
[im 60/66]
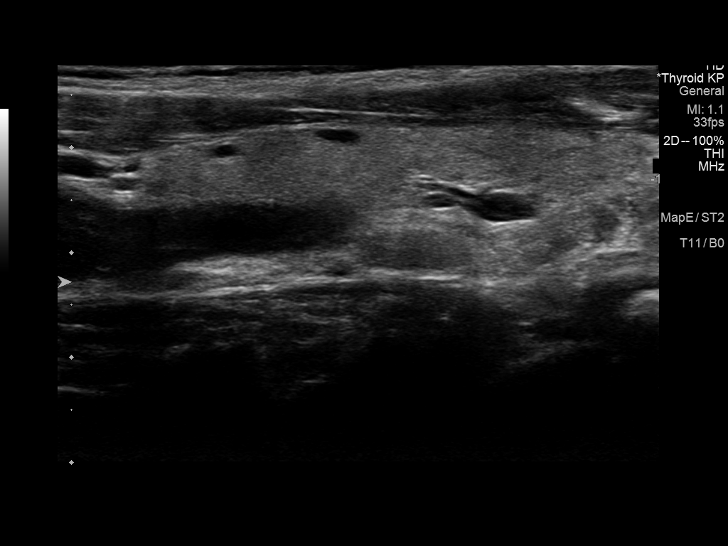
[im 66/66]
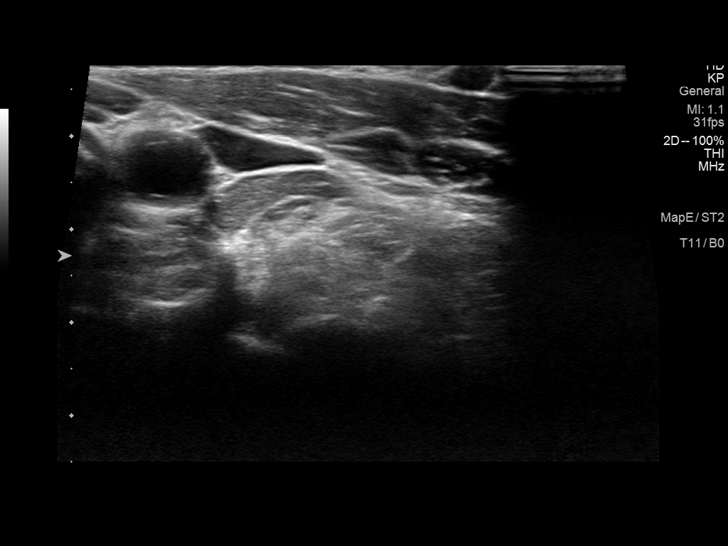

[13 of 25 positions shown; findings below may reference images not displayed]

FINDINGS: Parenchymal Echotexture: Mildly heterogenous

Isthmus: 0.5 cm

Right lobe: 6.6 x 2.2 x 1.9 cm

Left lobe: 5.4 x 1.6 x 1.6 cm

_________________________________________________________

Estimated total number of nodules >/= 1 cm: 2

Number of spongiform nodules >/=  2 cm not described below (TR1): 0

Number of mixed cystic and solid nodules >/= 1.5 cm not described
below (TR2): 0

_________________________________________________________

Nodule # 1:

Location: Right; Inferior

Maximum size: 3.1 cm; Other 2 dimensions: 2.0 x 1.7 cm

Composition: cannot determine (2)

Echogenicity: cannot determine (1)

Shape: not taller-than-wide (0)

Margins: ill-defined (0)

Echogenic foci: peripheral calcifications (2)

ACR TI-RADS total points: 5.

ACR TI-RADS risk category: TR4 (4-6 points).

ACR TI-RADS recommendations:

**Given size (>/= 1.5 cm) and appearance, fine needle aspiration of
this moderately suspicious nodule should be considered based on
TI-RADS criteria.

_________________________________________________________

Nodule # 2:

Location: Left; Inferior

Maximum size: 1.3 cm; Other 2 dimensions: 1.0 x 0.9 cm

Composition: solid/almost completely solid (2)

Echogenicity: hypoechoic (2)

Shape: not taller-than-wide (0)

Margins: smooth (0)

Echogenic foci: none (0)

ACR TI-RADS total points: 4.

ACR TI-RADS risk category: TR4 (4-6 points).

ACR TI-RADS recommendations:

*Given size (>/= 1 - 1.4 cm) and appearance, a follow-up ultrasound
in 1 year should be considered based on TI-RADS criteria.

_________________________________________________________
IMPRESSION: 1. Approximately 3.1 cm TI-RADS category 4 (moderately suspicious)
thyroid nodule in the right inferior gland meets criteria to
consider fine-needle aspiration biopsy.
2. A second smaller 1.3 cm TI-RADS category 4 nodule in the left
inferior gland meets criteria for imaging surveillance. Recommend
follow-up ultrasound in 1 year.

The above is in keeping with the ACR TI-RADS recommendations - [HOSPITAL] 7077;[DATE].

## 2021-01-25 ENCOUNTER — Other Ambulatory Visit: Payer: Self-pay | Admitting: Internal Medicine

## 2021-02-06 IMAGING — US US FNA BIOPSY THYROID 1ST LESION
1 series · 13 of 20 positions shown · non-contrast
Comparison: Thyroid ultrasound dated 08/22/2019

MEDICATIONS:
None

COMPLICATIONS:
None immediate.

INDICATION: Patient with history of incidental thyroid nodule noted on CT scan
and follow-up thyroid ultrasound on 08/22/2019 which revealed a
cm right inferior nodule which meets criteria for biopsy. He
presents today for the procedure.

EXAM:
ULTRASOUND GUIDED FINE NEEDLE ASPIRATION BIOPSY OF RIGHT INFERIOR
THYROID NODULE
TECHNIQUE: Informed written consent was obtained from the patient after a
discussion of the risks, benefits and alternatives to treatment.
Questions regarding the procedure were encouraged and answered. A
timeout was performed prior to the initiation of the procedure.

[Series 1: us fna biopsy thyroid 1st lesion · 0.06mm/px · 20 acquisitions, 13 frames shown]
[im 1/20]
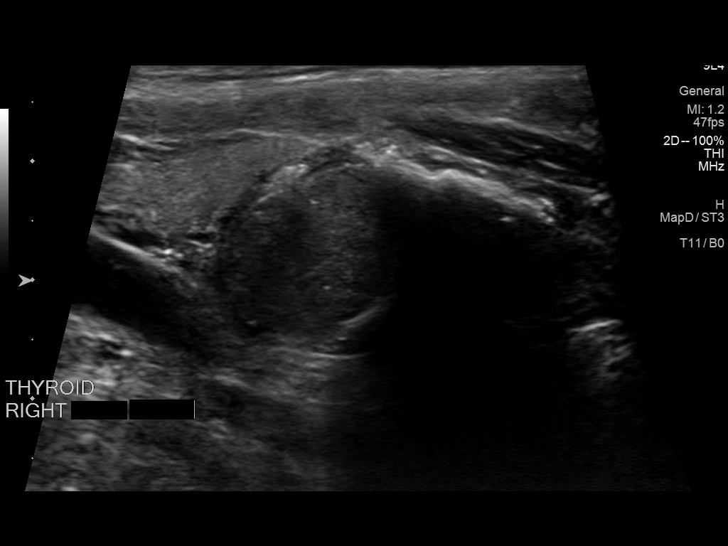
[im 3/20]
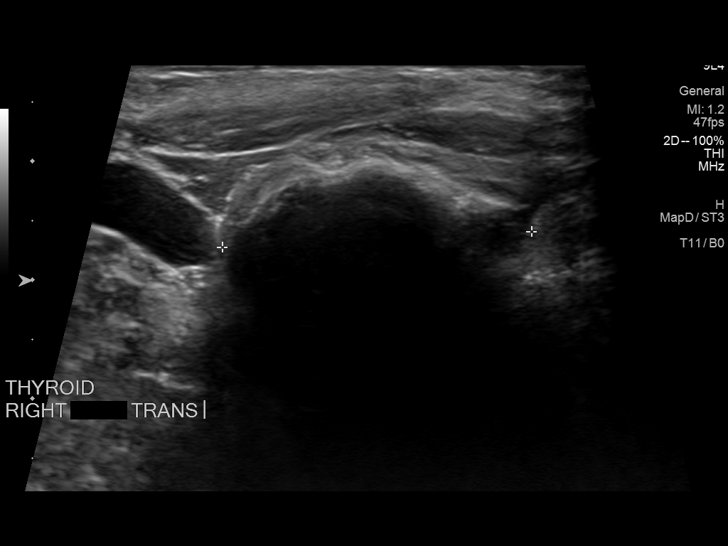
[im 4/20]
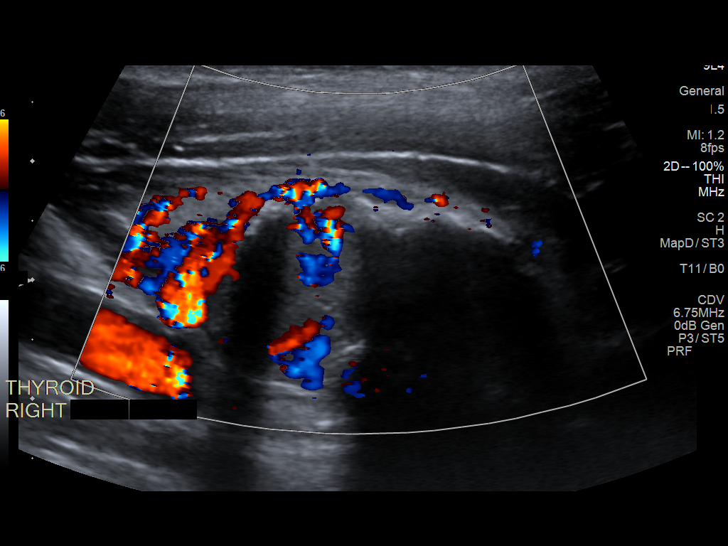
[im 6/20]
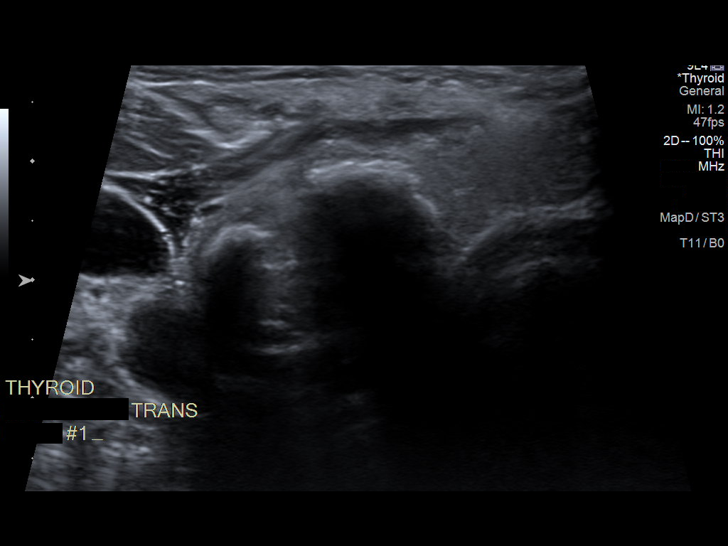
[im 7/20]
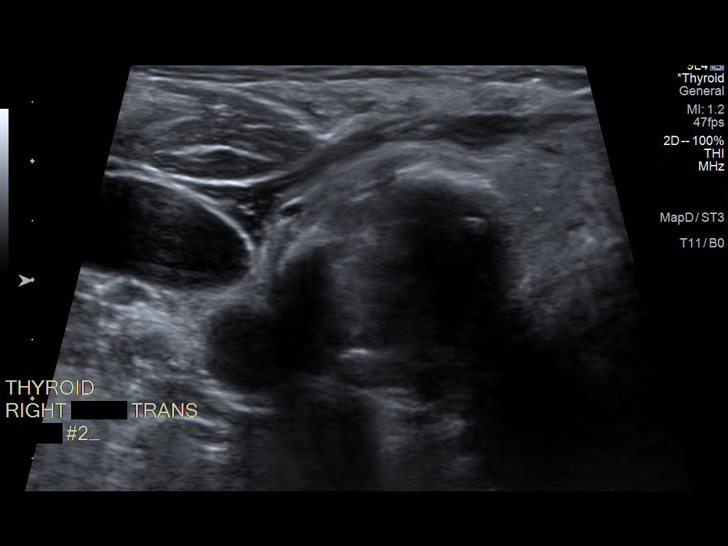
[im 9/20]
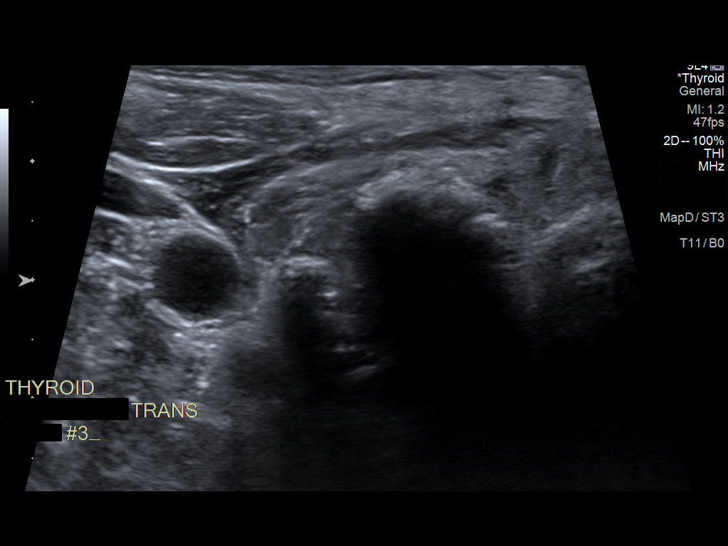
[im 11/20]
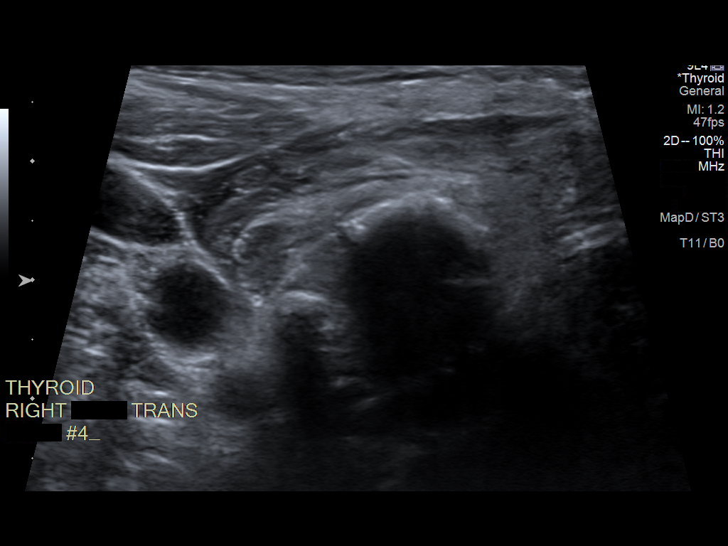
[im 12/20]
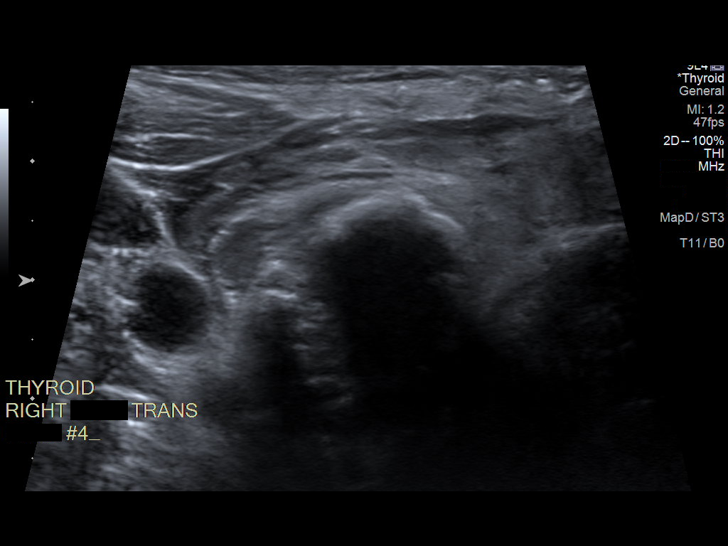
[im 14/20]
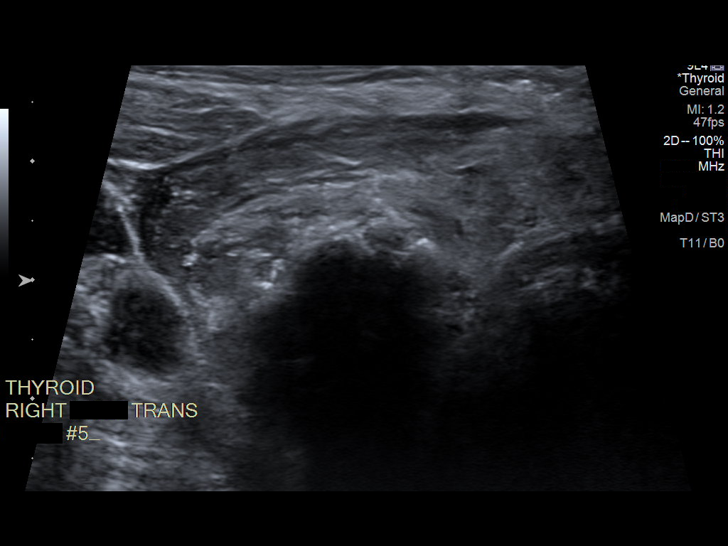
[im 15/20]
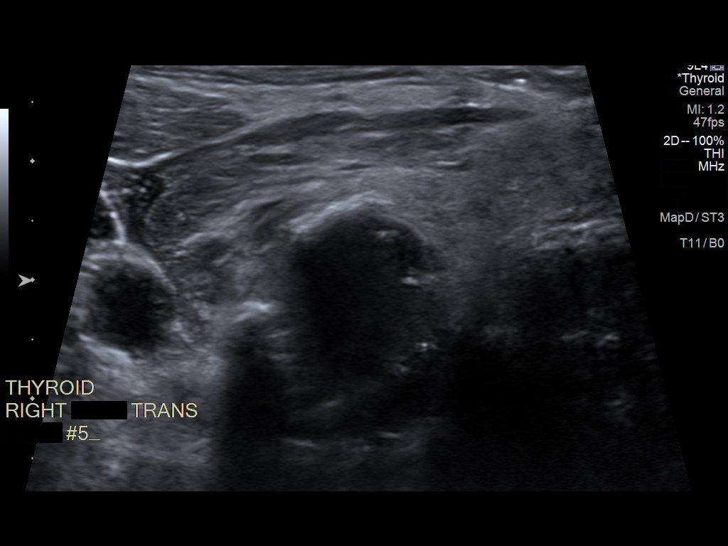
[im 17/20]
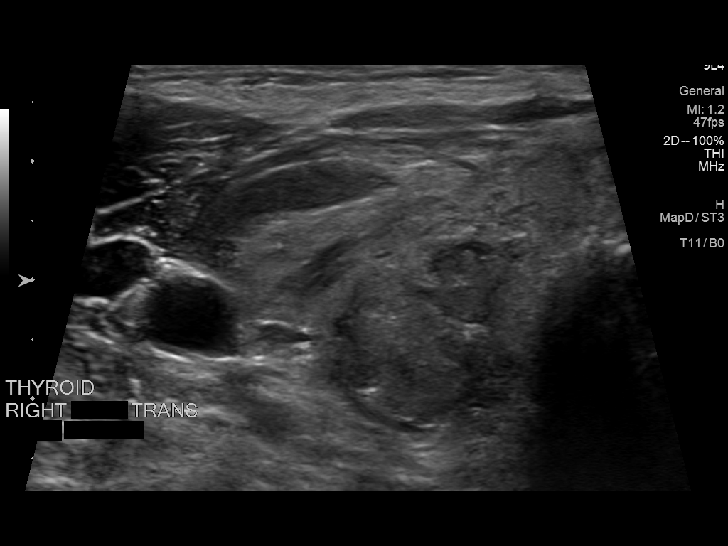
[im 18/20]
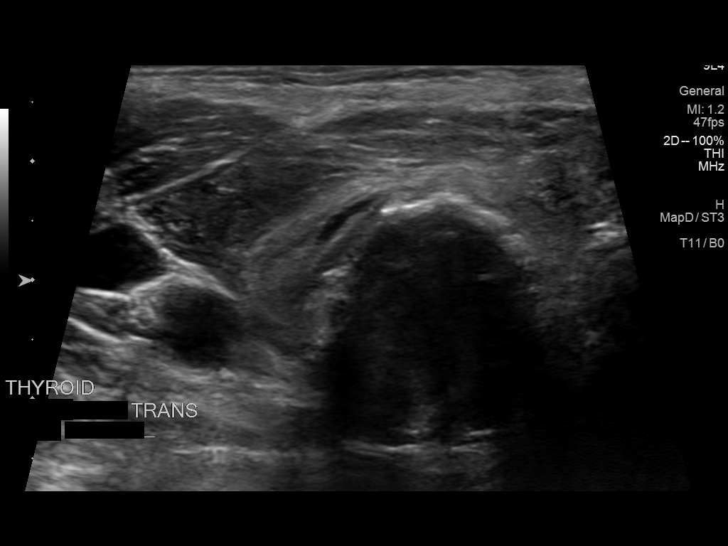
[im 20/20]
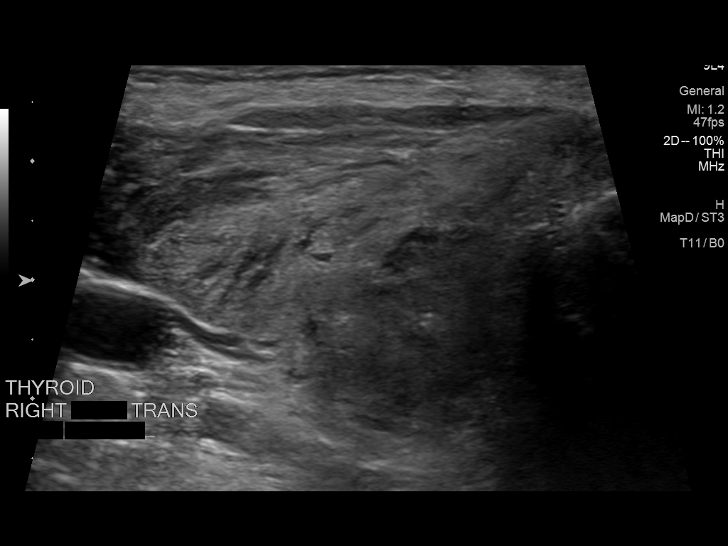

[13 of 20 positions shown; findings below may reference images not displayed]

Pre-procedural ultrasound scanning demonstrated unchanged size and
appearance of the indeterminate nodule within the right inferior
thyroid lobe

The procedure was planned. The neck was prepped in the usual sterile
fashion, and a sterile drape was applied covering the operative
field. A timeout was performed prior to the initiation of the
procedure. Local anesthesia was provided with 1% lidocaine.

Under direct ultrasound guidance, 5 FNA biopsies were performed of
the right inferior thyroid nodule with 25 gauge needles. Multiple
ultrasound images were saved for procedural documentation purposes.
The samples were prepared and submitted to pathology as well as for
Afirma testing.

Limited post procedural scanning was negative for hematoma or
additional complication. Dressings were placed. The patient
tolerated the above procedures procedure well without immediate
postprocedural complication.
FINDINGS: Nodule reference number based on prior diagnostic ultrasound: 1

Maximum size: 3.1 cm

Location: Right; Inferior

ACR TI-RADS risk category: TR4 (4-6 points)

Reason for biopsy: meets ACR TI-RADS criteria

Ultrasound imaging confirms appropriate placement of the needles
within the thyroid nodule.
IMPRESSION: Technically successful ultrasound guided fine needle aspiration
biopsy of right inferior thyroid nodule. Final pathology pending.

## 2021-08-27 ENCOUNTER — Encounter: Payer: Self-pay | Admitting: Internal Medicine

## 2021-08-27 ENCOUNTER — Other Ambulatory Visit: Payer: Self-pay

## 2021-08-27 ENCOUNTER — Ambulatory Visit: Payer: Medicare HMO | Admitting: Internal Medicine

## 2021-08-27 VITALS — BP 150/80 | HR 64 | Ht 70.0 in | Wt 159.2 lb

## 2021-08-27 DIAGNOSIS — E042 Nontoxic multinodular goiter: Secondary | ICD-10-CM | POA: Diagnosis not present

## 2021-08-27 NOTE — Progress Notes (Signed)
Name: Brandon DANCY Sr.  MRN/ DOB: [id number], Jun 13, 1946    Age/ Sex: 76 y.o., male     PCP: Theodoro Kos, MD   Reason for Endocrinology Evaluation: MNG     Initial Endocrinology Clinic Visit: 09/07/2019    PATIENT IDENTIFIER: [name]. is a 76 y.o., male with a past medical history of Dyslipidemia. He has followed with [facility] since 09/07/2019 for consultative assistance with management of his MNG.   HISTORICAL SUMMARY:  Brandon Zamora presented to [facility] neurology for evaluation of transient loss of vision.  During imaging he was found to have an incidental right thyroid nodule on the CT scan.  This prompted a thyroid ultrasound which showed a right inferior thyroid nodule with a max diameter of 3.1 cm meeting criteria for FNA.  He also was found to have a left inferior thyroid nodule that does not meet criteria for FNA but would require an evaluation in 1 year.  He is as/P FNA of the right inferior nodule 3.1 cm with FL US(Bethesda category III) on 09/19/2019, with benign Afirma results.  SUBJECTIVE:    Today (08/27/2021):  Brandon Zamora is here for a follow up on MNG.   Weight stable  Denies palpitations  Denies diarrhea or constipation  Denies local neck symptoms  Had transient dysphagia a few months ago but this has resolved         HISTORY:  Past Medical History:  Past Medical History:  Diagnosis Date   Degenerative joint disease    Hemorrhoids    Hypercholesteremia    Neutropenia (HCC)    Osteomyelitis hip (HCC)    Thyroid nodule    bilateral   Transient blindness of both eyes    Past Surgical History:  Past Surgical History:  Procedure Laterality Date   ANTERIOR CERVICAL DECOMPRESSION/DISCECTOMY FUSION 4 LEVELS N/A 12/15/2019   Procedure: Anterior Cervical Discectomy Fusion - Cervical Three-Cervical Four - Cervical Four-Cervical Five - Cervical Five-Cervical Six - Cervical Six-Cervical Seven;  Surgeon: Julio Sicks, MD;   Location: [facility] OR;  Service: Neurosurgery;  Laterality: N/A;  Anterior Cervical Discectomy Fusion - Cervical Three-Cervical Four - Cervical Four-Cervical Five - Cervical Five-Cervical Six - Cervical Six-Cerv   COLONOSCOPY     TONSILLECTOMY     TOTAL HIP ARTHROPLASTY Bilateral    WISDOM TOOTH EXTRACTION     Social History:  reports that he has never smoked. He has never used smokeless tobacco. He reports current alcohol use. He reports that he does not use drugs. Family History:  Family History  Problem Relation Age of Onset   Dementia Mother    Heart disease Father      HOME MEDICATIONS: Allergies as of 08/27/2021   No Known Allergies      Medication List        Accurate as of August 27, 2021  4:41 PM. If you have any questions, ask your nurse or doctor.          aspirin 81 MG EC tablet Take 1 tablet (81 mg total) by mouth daily.   atorvastatin 10 MG tablet Commonly known as: LIPITOR Take 10 mg by mouth daily. What changed: Another medication with the same name was removed. Continue taking this medication, and follow the directions you see here. Changed by: Scarlette Shorts, MD   Coenzyme Q10 200 MG capsule Take 200 mg by mouth daily.   multivitamin tablet Take 1 tablet by mouth daily.  OBJECTIVE:   PHYSICAL EXAM: VS: BP (!) 150/80 (BP Location: Left Arm, Patient Position: Sitting, Cuff Size: Small)    Pulse 64    Ht 5\' 10"  (1.778 m)    Wt 159 lb 3.2 oz (72.2 kg)    SpO2 99%    BMI 22.84 kg/m    EXAM: General: Pt appears well and is in NAD  Neck: General: Supple without adenopathy. Thyroid: Thyroid size normal.  Small right inferior nodule palpated   Lungs: Clear with good BS bilat with no rales, rhonchi, or wheezes  Heart: Auscultation: RRR.  Abdomen: Normoactive bowel sounds, soft, nontender, without masses or organomegaly palpable  Extremities:  BL LE: No pretibial edema normal ROM and strength.  Mental Status: Judgment, insight:  Intact Orientation: Oriented to time, place, and person Mood and affect: No depression, anxiety, or agitation     DATA REVIEWED:  08/18/2021 TSH 2.10 FT4 0.75  Thyroid Ultrasound 09/03/2020   Nodule # 1:   Prior biopsy: Yes   Location: Right; Inferior   Maximum size: 3.4 cm; Other 2 dimensions: 2.6 x 1.6 cm, previously, 3.1 x 2.0 x 1.7 cm   Composition: cannot determine (2)   Echogenicity: cannot determine (1)   Shape: not taller-than-wide (0)   Margins: ill-defined (0)   Echogenic foci: peripheral calcifications (2)   ACR TI-RADS total points: 5.   ACR TI-RADS risk category:  TR4 (4-6 points).   Significant change in size (>/= 20% in two dimensions and minimal increase of 2 mm): No   Change in features: No   Change in ACR TI-RADS risk category: No   ACR TI-RADS recommendations:   Correlation with prior biopsy results.   _________________________________________________________   Nodule # 2:   Prior biopsy: No   Location: Left; Inferior   Maximum size: 1.1 cm; Other 2 dimensions: 0.8 x 0.6 cm, previously, 1.3 x 1.0 x 1.0 cm   Composition: solid/almost completely solid (2)   Echogenicity: hypoechoic (2)   Shape: not taller-than-wide (0)   Margins: ill-defined (0)   Echogenic foci: none (0)   ACR TI-RADS total points: 4.   ACR TI-RADS risk category:  TR4 (4-6 points).   Significant change in size (>/= 20% in two dimensions and minimal increase of 2 mm): No   Change in features: No   Change in ACR TI-RADS risk category: No   ACR TI-RADS recommendations:   *Given size (>/= 1 - 1.4 cm) and appearance, a follow-up ultrasound in 1 year should be considered based on TI-RADS criteria.   _________________________________________________________   IMPRESSION: 1. Slight interval decreased size of previously visualized left inferior solid thyroid nodule (labeled 2, 1.1 cm, which meets criteria (TI-RADS category 4) for 1 year ultrasound  surveillance. 2. Unchanged appearance of previously biopsied right inferior thyroid nodule. Recommend correlation with prior biopsy results. 3. No new discrete nodules.  FNA 09/19/2019  Right inferior 3.1cm; Other 2 dimensions: 2.0 x 1.7cm,  Peripheral calcifications, TI-RADS total points 6  Specimen Submitted:  A. 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  ASSESSMENT / PLAN / RECOMMENDATIONS:   Multinodular goiter   - Pt is clinically and biochemicaly euthyroid  - No local neck symptoms  - S/P right inferior FNA with FLUS (Bethesda category III) with benign Afirma  (09/2019) -We will repeat thyroid ultrasound this year   Follow-up in 1 yr  Signed electronically by: Lyndle Herrlich, MD  [facility] Endocrinology  [facility] [address] [place] [place], [place] [address] Phone: [phone] FAX: [phone]      CC: Theodoro Kos, MD [address] [place] [address] Phone: [phone]  Fax: [phone]   Return to Endocrinology clinic as below: Future Appointments  Date Time Provider Department Center  09/01/2022  8:30 AM Kalese Ensz, Konrad Dolores, MD [facility] None

## 2021-08-28 ENCOUNTER — Encounter: Payer: Self-pay | Admitting: Internal Medicine

## 2021-09-09 ENCOUNTER — Ambulatory Visit
Admission: RE | Admit: 2021-09-09 | Discharge: 2021-09-09 | Disposition: A | Discharge status: Home / Self Care | Payer: Medicare HMO | Source: Ambulatory Visit | Attending: Internal Medicine | Admitting: Internal Medicine

## 2021-09-09 DIAGNOSIS — E042 Nontoxic multinodular goiter: Secondary | ICD-10-CM

## 2022-09-01 ENCOUNTER — Ambulatory Visit: Payer: Medicare HMO | Admitting: Internal Medicine

## 2022-09-01 ENCOUNTER — Encounter: Payer: Self-pay | Admitting: Internal Medicine

## 2022-09-01 VITALS — BP 120/70 | HR 60 | Ht 70.0 in | Wt 161.0 lb

## 2022-09-01 DIAGNOSIS — E042 Nontoxic multinodular goiter: Secondary | ICD-10-CM | POA: Diagnosis not present

## 2022-09-01 NOTE — Progress Notes (Signed)
Name: Brandon LEAMY Sr.  MRN/ DOB: [id number], 1946-01-09    Age/ Sex: 77 y.o., male     PCP: Galen Manila, MD   Reason for Endocrinology Evaluation: MNG     Initial Endocrinology Clinic Visit: 09/07/2019    PATIENT IDENTIFIER: Brandon Zamora. is a 77 y.o., male with a past medical history of Dyslipidemia. He has followed with [facility] since 09/07/2019 for consultative assistance with management of his MNG.   HISTORICAL SUMMARY:  Brandon Zamora presented to [facility] neurology for evaluation of transient loss of vision.  During imaging he was found to have an incidental right thyroid nodule on the CT scan.  This prompted a thyroid ultrasound which showed a right inferior thyroid nodule with a max diameter of 3.1 cm meeting criteria for FNA.  He also was found to have a left inferior thyroid nodule that does not meet criteria for FNA but would require an evaluation in 1 year.  He is as/P FNA of the right inferior nodule 3.1 cm with FL US(Bethesda category III) on 09/19/2019, with benign Afirma results.  SUBJECTIVE:    Today (09/01/2022):  Brandon Zamora is here for a follow up on MNG.    Weight has been fluctuating  Denies local neck swelling  Denies palpitations  Denies tremors  Denies diarrhea or constipation    HISTORY:  Past Medical History:  Past Medical History:  Diagnosis Date   Degenerative joint disease    Hemorrhoids    Hypercholesteremia    Neutropenia (HCC)    Osteomyelitis hip (HCC)    Thyroid nodule    bilateral   Transient blindness of both eyes    Past Surgical History:  Past Surgical History:  Procedure Laterality Date   ANTERIOR CERVICAL DECOMPRESSION/DISCECTOMY FUSION 4 LEVELS N/A 12/15/2019   Procedure: Anterior Cervical Discectomy Fusion - Cervical Three-Cervical Four - Cervical Four-Cervical Five - Cervical Five-Cervical Six - Cervical Six-Cervical Seven;  Surgeon: Earnie Larsson, MD;  Location: [facility];  Service: Neurosurgery;   Laterality: N/A;  Anterior Cervical Discectomy Fusion - Cervical Three-Cervical Four - Cervical Four-Cervical Five - Cervical Five-Cervical Six - Cervical Six-Cerv   COLONOSCOPY     TONSILLECTOMY     TOTAL HIP ARTHROPLASTY Bilateral    WISDOM TOOTH EXTRACTION     Social History:  reports that he has never smoked. He has never used smokeless tobacco. He reports current alcohol use. He reports that he does not use drugs. Family History:  Family History  Problem Relation Age of Onset   Dementia Mother    Heart disease Father      HOME MEDICATIONS: Allergies as of 09/01/2022   No Known Allergies      Medication List        Accurate as of September 01, 2022  7:01 AM. If you have any questions, ask your nurse or doctor.          aspirin EC 81 MG tablet Take 1 tablet (81 mg total) by mouth daily.   atorvastatin 10 MG tablet Commonly known as: LIPITOR Take 10 mg by mouth daily.   Coenzyme Q10 200 MG capsule Take 200 mg by mouth daily.   multivitamin tablet Take 1 tablet by mouth daily.          OBJECTIVE:   PHYSICAL EXAM: VS: There were no vitals taken for this visit.   EXAM: General: Pt appears well and is in NAD  Neck: General: Supple without adenopathy. Thyroid: Thyroid size  normal.  Small right inferior nodule palpated   Lungs: Clear with good BS bilat with no rales, rhonchi, or wheezes  Heart: Auscultation: RRR.  Abdomen: Normoactive bowel sounds, soft, nontender, without masses or organomegaly palpable  Extremities:  BL LE: No pretibial edema normal ROM and strength.  Mental Status: Judgment, insight: Intact Orientation: Oriented to time, place, and person Mood and affect: No depression, anxiety, or agitation     DATA REVIEWED:  08/19/2022  TSH 1.786 uIU/mL   Thyroid Ultrasound 09/09/2021 Estimated total number of nodules >/= 1 cm: 1   Number of spongiform nodules >/=  2 cm not described below (TR1): 0   Number of mixed cystic and solid  nodules >/= 1.5 cm not described below (TR2): 0   _________________________________________________________   Continued decreased size of previously biopsied right inferior solid thyroid nodule with peripheral calcifications (labeled 1, 3.0 cm, previously 3.4 cm).   Nodule # 2:   Prior biopsy: No   Location: Left; Mid   Maximum size: 0.9 cm; Other 2 dimensions: 0.6 x 0.5 cm, previously, 1.1 x 0.8 x 0.6 cm   Composition: solid/almost completely solid (2)   Echogenicity: hypoechoic (2)   Shape: not taller-than-wide (0)   Margins: ill-defined (0)   Echogenic foci: none (0)   ACR TI-RADS total points: 4.   ACR TI-RADS risk category:  TR4 (4-6 points).   Significant change in size (>/= 20% in two dimensions and minimal increase of 2 mm): No   Change in features: No   Change in ACR TI-RADS risk category: No   ACR TI-RADS recommendations:   Given size (<1.0 cm) and appearance, this nodule does NOT meet TI-RADS criteria for biopsy or dedicated follow-up.   _________________________________________________________   No cervical lymphadenopathy.   IMPRESSION: 1. Continued involution of previously visualized left mid solid thyroid nodule (labeled 2, 0.9 cm, previously 1.1 cm), which due to subcentimeter size is now characterized as benign and does not warrant additional ultrasound follow-up or tissue sampling. 2. Unchanged appearance of previously biopsied right inferior thyroid nodule (labeled 1, 3.0 cm, previously 3.4 cm). 3. No new discrete nodules.  FNA 09/19/2019  Right inferior 3.1cm; Other 2 dimensions: 2.0 x 1.7cm,  Peripheral calcifications, TI-RADS total points 6  Specimen Submitted:  A. 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  ASSESSMENT / PLAN / RECOMMENDATIONS:   Multinodular goiter   - Pt is clinically and  biochemicaly euthyroid  - No local neck symptoms  - S/P right inferior FNA with FLUS (Bethesda category III) with benign Afirma  (09/2019) -Thyroid nodule size has been ranging between 3.0 to 3.4 cm  -We discussed annual checkups for up to 5 years to confirm stability -Thyroid ultrasound has been ordered for this year   Follow-up in 1 yr    Signed electronically by: Mack Guise, MD  [facility] Endocrinology  [facility] [address] [place], [place] [address] Phone: [phone] FAX: [phone]      CC: Galen Manila, MD [address] [place] [address] Phone: [phone]  Fax: [phone]   Return to Endocrinology clinic as below: Future Appointments  Date Time Provider [place]  09/01/2022  8:30 AM Loray Akard, Melanie Crazier, MD [facility] None

## 2022-09-25 ENCOUNTER — Ambulatory Visit
Admission: RE | Admit: 2022-09-25 | Discharge: 2022-09-25 | Disposition: A | Discharge status: Home / Self Care | Payer: Medicare HMO | Source: Ambulatory Visit | Attending: Internal Medicine | Admitting: Internal Medicine

## 2022-09-25 DIAGNOSIS — E042 Nontoxic multinodular goiter: Secondary | ICD-10-CM

## 2023-01-25 ENCOUNTER — Encounter (HOSPITAL_COMMUNITY): Payer: Self-pay

## 2023-08-13 ENCOUNTER — Telehealth: Payer: Self-pay | Admitting: Internal Medicine

## 2023-08-13 NOTE — Telephone Encounter (Signed)
Brandon Zamora called and said he has a follow up appointment with Dr Lonzo Cloud on 09/02/23. Brandon Zamora said that he thought that Dr Lonzo Cloud wanted him to have more imaging done at the beginning of February before his follow up. I didn't see any orders in. Brandon Zamora would like someone clinical to follow up with him on this. Please advise. Call back is [phone].

## 2023-08-13 NOTE — Telephone Encounter (Signed)
LDTVM that no other imaging was needed prior to this appointment

## 2023-09-02 ENCOUNTER — Encounter: Payer: Self-pay | Admitting: Internal Medicine

## 2023-09-02 ENCOUNTER — Ambulatory Visit
Admission: RE | Admit: 2023-09-02 | Discharge: 2023-09-02 | Disposition: A | Discharge status: Home / Self Care | Payer: Medicare HMO | Source: Ambulatory Visit | Attending: Internal Medicine | Admitting: Internal Medicine

## 2023-09-02 ENCOUNTER — Ambulatory Visit: Payer: Medicare HMO | Admitting: Internal Medicine

## 2023-09-02 VITALS — BP 120/80 | HR 57 | Ht 70.0 in | Wt 162.0 lb

## 2023-09-02 DIAGNOSIS — E042 Nontoxic multinodular goiter: Secondary | ICD-10-CM

## 2023-09-02 NOTE — Progress Notes (Signed)
Name: Brandon EISSLER Sr.  MRN/ DOB: [id number], 1945-07-28    Age/ Sex: 78 y.o., male     PCP: Theodoro Kos, MD   Reason for Endocrinology Evaluation: MNG     Initial Endocrinology Clinic Visit: 09/07/2019    PATIENT IDENTIFIER: [name]. is a 78 y.o., male with a past medical history of Dyslipidemia. He has followed with [facility] since 09/07/2019 for consultative assistance with management of his MNG.   HISTORICAL SUMMARY:  Brandon Zamora presented to [facility] neurology for evaluation of transient loss of vision.  During imaging he was found to have an incidental right thyroid nodule on the CT scan.  This prompted a thyroid ultrasound which showed a right inferior thyroid nodule with a max diameter of 3.1 cm meeting criteria for FNA.  He also was found to have a left inferior thyroid nodule that does not meet criteria for FNA but would require an evaluation in 1 year.  He is as/P FNA of the right inferior nodule 3.1 cm with FL US(Bethesda category III) on 09/19/2019, with benign Afirma results.  SUBJECTIVE:    Today (09/02/2023):  Brandon Zamora is here for a follow up on MNG.    Weight has been  stable  Denies local neck swelling  Denies palpitations  Denies tremors  Denies constipation but has soft stools  Energy level is not as good that he attributes to 78 .  Pt had cardiac eval in 02/2023 for chest pain and elevated troponins, work up negative for CAD  HISTORY:  Past Medical History:  Past Medical History:  Diagnosis Date   Degenerative joint disease    Hemorrhoids    Hypercholesteremia    Neutropenia (HCC)    Osteomyelitis hip (HCC)    Thyroid nodule    bilateral   Transient blindness of both eyes    Past Surgical History:  Past Surgical History:  Procedure Laterality Date   ANTERIOR CERVICAL DECOMPRESSION/DISCECTOMY FUSION 4 LEVELS N/A 12/15/2019   Procedure: Anterior Cervical Discectomy Fusion - Cervical Three-Cervical Four -  Cervical Four-Cervical Five - Cervical Five-Cervical Six - Cervical Six-Cervical Seven;  Surgeon: Julio Sicks, MD;  Location: [facility] OR;  Service: Neurosurgery;  Laterality: N/A;  Anterior Cervical Discectomy Fusion - Cervical Three-Cervical Four - Cervical Four-Cervical Five - Cervical Five-Cervical Six - Cervical Six-Cerv   COLONOSCOPY     TONSILLECTOMY     TOTAL HIP ARTHROPLASTY Bilateral    WISDOM TOOTH EXTRACTION     Social History:  reports that he has never smoked. He has never used smokeless tobacco. He reports current alcohol use. He reports that he does not use drugs. Family History:  Family History  Problem Relation Age of Onset   Dementia Mother    Heart disease Father      HOME MEDICATIONS: Allergies as of 09/02/2023   No Known Allergies      Medication List        Accurate as of September 02, 2023  8:41 AM. If you have any questions, ask your nurse or doctor.          aspirin EC 81 MG tablet Take 1 tablet (81 mg total) by mouth daily.   Coenzyme Q10 200 MG capsule Take 200 mg by mouth daily.   Lipitor 20 MG tablet Generic drug: atorvastatin Take 20 mg by mouth daily.   multivitamin tablet Take 1 tablet by mouth daily.          OBJECTIVE:  PHYSICAL EXAM: VS: BP 120/80 (BP Location: Left Arm, Patient Position: Sitting, Cuff Size: Small)   Pulse (!) 57   Ht 5\' 10"  (1.778 m)   Wt 162 lb (73.5 kg)   SpO2 97%   BMI 23.24 kg/m    EXAM: General: Pt appears well and is in NAD  Neck: General: Supple without adenopathy. Thyroid: Right inferior nodule appreciated   Lungs: Clear with good BS bilat   Heart: Auscultation: RRR.  Abdomen:  soft, nontender  Extremities:  BL LE: No pretibial edema   Mental Status: Judgment, insight: Intact Orientation: Oriented to time, place, and person Mood and affect: No depression, anxiety, or agitation     DATA REVIEWED:   Thyroid Ultrasound 09/25/2022  Narrative & Impression  CLINICAL DATA:  Prior ultrasound  follow-up.   EXAM: THYROID ULTRASOUND   TECHNIQUE: Ultrasound examination of the thyroid gland and adjacent soft tissues was performed.   COMPARISON:  March 2023; 2021   FINDINGS: Parenchymal Echotexture: Mildly heterogenous   Isthmus: 0.4 cm   Right lobe: 6.3 x 2.7 x 2.6 cm   Left lobe: 4.5 x 2.1 x 1.5 cm   _________________________________________________________   Estimated total number of nodules >/= 1 cm: 1   Number of spongiform nodules >/=  2 cm not described below (TR1): 0   Number of mixed cystic and solid nodules >/= 1.5 cm not described below (TR2): 0   _________________________________________________________   Nodule labeled 1 is a previously biopsied peripherally calcified nodule in the inferior right thyroid lobe measuring up to 3.0 cm. It remains similar in size and morphology.   Nodule labeled 2 is a small subcentimeter slightly hypoechoic TR 4 nodule in the mid left thyroid lobe measuring 0.7 cm on today's exam, previously 0.9 cm in 2023 and 1.3 cm in 2021. It has significantly decreased in volume when compared to the 2021 examination, and it continues to slightly decrease in size. Given size (<0.9 cm) and appearance, this nodule does NOT meet TI-RADS criteria for biopsy or dedicated follow-up.   IMPRESSION: 1. Multinodular thyroid gland without new or enlarging thyroid nodule. 2. Similar appearance of previously biopsied nodule in the inferior right thyroid lobe. Correlate with biopsy results. 3. Continued decrease in size of the small subcentimeter nodule in the mid left thyroid lobe, consistent with a benign nodule which does not meet criteria for further dedicated follow-up or biopsy.        FNA 09/19/2019  Right inferior 3.1cm; Other 2 dimensions: 2.0 x 1.7cm,  Peripheral calcifications, TI-RADS total points 6  Specimen Submitted:  A. 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     ASSESSMENT / PLAN / RECOMMENDATIONS:   Multinodular goiter   - Pt is clinically euthyroid  -He is scheduled to have echo with his PCP next month and thus typically when he gets his TFTs done - No local neck symptoms  - S/P right inferior FNA with FLUS (Bethesda category III) with benign Afirma  (09/2019) -Thyroid nodule size has been ranging between 3.0 to 3.4 cm  -Thyroid ultrasound has been ordered for this year   Follow-up in 1 yr    Signed electronically by: Lyndle Herrlich, MD  [facility]  [facility] [address] [place] [place], [place] [address] Phone: [phone] FAX: [phone]      CC: Theodoro Kos, MD [phone] [facility]  [place] [address] Phone: [phone]  Fax: [phone]   Return to Endocrinology clinic as below: No future appointments.

## 2023-09-10 ENCOUNTER — Encounter: Payer: Self-pay | Admitting: Internal Medicine

## 2024-02-25 ENCOUNTER — Other Ambulatory Visit: Payer: Self-pay | Admitting: Medical Genetics

## 2024-02-28 ENCOUNTER — Other Ambulatory Visit (HOSPITAL_COMMUNITY)
Admission: RE | Admit: 2024-02-28 | Discharge: 2024-02-28 | Disposition: A | Discharge status: Home / Self Care | Payer: Self-pay | Source: Ambulatory Visit | Attending: Medical Genetics | Admitting: Medical Genetics

## 2024-03-07 LAB — GENECONNECT MOLECULAR SCREEN: Genetic Analysis Overall Interpretation: NEGATIVE

## 2024-08-23 ENCOUNTER — Ambulatory Visit: Admitting: Internal Medicine

## 2024-08-23 ENCOUNTER — Other Ambulatory Visit

## 2024-08-23 ENCOUNTER — Encounter: Payer: Self-pay | Admitting: Internal Medicine

## 2024-08-23 VITALS — BP 130/72 | HR 75 | Ht 70.0 in | Wt 163.0 lb

## 2024-08-23 DIAGNOSIS — E042 Nontoxic multinodular goiter: Secondary | ICD-10-CM

## 2024-08-23 NOTE — Progress Notes (Unsigned)
 "  Name: Brandon PRUSINSKI Sr.  MRN/ DOB: [id number], January 25, 1946    Age/ Sex: 79 y.o., male     PCP: Ezzard Elsie NOVAK, MD   Reason for Endocrinology Evaluation: MNG     Initial Endocrinology Clinic Visit: 09/07/2019    PATIENT IDENTIFIER: [name]. is a 79 y.o., male with a past medical history of Dyslipidemia. He has followed with [facility] since 09/07/2019 for consultative assistance with management of his MNG.   HISTORICAL SUMMARY:  Brandon Zamora presented to [facility] neurology for evaluation of transient loss of vision.  During imaging he was found to have an incidental right thyroid  nodule on the CT scan.  This prompted a thyroid  ultrasound which showed a right inferior thyroid  nodule with a max diameter of 3.1 cm meeting criteria for FNA.  He also was found to have a left inferior thyroid  nodule that does not meet criteria for FNA but would require an evaluation in 1 year.  He is as/P FNA of the right inferior nodule 3.1 cm with FL US (Bethesda category III) on 09/19/2019, with benign Afirma results.  SUBJECTIVE:    Today (08/23/2024):  Brandon Zamora is here for a follow up on MNG.   The patient was evaluated for chronic diarrhea through his PCPs office in the summer, 2025 Started probiotics which resolved his diarrhea  Weight has been stable over the year No local neck swelling  No palpitations  No hand tremors, but has occasional right arm  electric shock  No dysphagia     HISTORY:  Past Medical History:  Past Medical History:  Diagnosis Date   Degenerative joint disease    Hemorrhoids    Hypercholesteremia    Neutropenia    Osteomyelitis hip (HCC)    Thyroid  nodule    bilateral   Transient blindness of both eyes    Past Surgical History:  Past Surgical History:  Procedure Laterality Date   ANTERIOR CERVICAL DECOMPRESSION/DISCECTOMY FUSION 4 LEVELS N/A 12/15/2019   Procedure: Anterior Cervical Discectomy Fusion - Cervical Three-Cervical  Four - Cervical Four-Cervical Five - Cervical Five-Cervical Six - Cervical Six-Cervical Seven;  Surgeon: Louis Shove, MD;  Location: [facility] OR;  Service: Neurosurgery;  Laterality: N/A;  Anterior Cervical Discectomy Fusion - Cervical Three-Cervical Four - Cervical Four-Cervical Five - Cervical Five-Cervical Six - Cervical Six-Cerv   COLONOSCOPY     TONSILLECTOMY     TOTAL HIP ARTHROPLASTY Bilateral    WISDOM TOOTH EXTRACTION     Social History:  reports that he has never smoked. He has never used smokeless tobacco. He reports current alcohol use. He reports that he does not use drugs. Family History:  Family History  Problem Relation Age of Onset   Dementia Mother    Heart disease Father      HOME MEDICATIONS: Allergies as of 08/23/2024   No Known Allergies      Medication List        Accurate as of August 23, 2024  9:14 AM. If you have any questions, ask your nurse or doctor.          aspirin  EC 81 MG tablet Take 1 tablet (81 mg total) by mouth daily.   Coenzyme Q10 200 MG capsule Take 200 mg by mouth daily.   Lipitor 20 MG tablet Generic drug: atorvastatin  Take 20 mg by mouth daily.   multivitamin tablet Take 1 tablet by mouth daily.          OBJECTIVE:   PHYSICAL  EXAM: VS: BP 130/72   Pulse 75   Ht 5' 10 (1.778 m)   Wt 163 lb (73.9 kg)   BMI 23.39 kg/m    EXAM: General: Pt appears well and is in NAD  Neck: General: Supple without adenopathy. Thyroid : No nodules appreciated on today's exam  Lungs: Clear with good BS bilat   Heart: Auscultation: RRR.  Extremities:  BL LE: No pretibial edema   Mental Status: Judgment, insight: Intact Orientation: Oriented to time, place, and person Mood and affect: No depression, anxiety, or agitation     DATA REVIEWED: ****  Thyroid  Ultrasound 09/02/2023  FINDINGS: Parenchymal Echotexture: Mildly heterogeneous   Isthmus: 0.5 cm ,previously 0.4 cm   Right lobe: 5.8 x 2.4 x 1.8 cm ,previously 6.3 x 2.7 x  2.6 cm   Left lobe: 4.4 x 1.8 x 1.7 cm ,previously 4.5 x 2.1 x 1.5 cm   ________________________________________________________   Estimated total number of nodules >/= 1 cm: 1   Number of spongiform nodules >/=  2 cm not described below (TR1): 0   Number of mixed cystic and solid nodules >/= 1.5 cm not described below (TR2): 0   _________________________________________________________   Slight interval decreased size of previously biopsied right inferior peripherally calcified thyroid  nodule (labeled 1, 2.4 cm, previously 3.0 cm).   Similar benign appearing subcentimeter left mid solid thyroid  nodule (labeled 2). No cervical lymphadenopathy.   IMPRESSION: Slightly decreased size and similar appearance of previously biopsied right inferior thyroid  nodule (labeled 1, 2.4 cm, previously 3.0 cm). Recommend correlation with prior biopsy results.       FNA 09/19/2019  Right inferior 3.1cm; Other 2 dimensions: 2.0 x 1.7cm,  Peripheral calcifications, TI-RADS total points 6  Specimen Submitted:  A. 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  If things remain stable, no further serial imaging will be recommended    Signed electronically by: Stefano Redgie Butts, MD  [facility]ocrinology  [facility] [address] [place] [place], [place] [address] Phone: [phone] FAX:  [phone]      CC: Ezzard Elsie NOVAK, MD [id number] [facility] [address] [place] [address] Phone: [phone]  Fax: [phone]   Return to Endocrinology clinic as below: No future appointments.    "

## 2024-08-24 ENCOUNTER — Ambulatory Visit: Payer: Self-pay | Admitting: Internal Medicine

## 2024-08-24 LAB — T4, FREE: Free T4: 1.2 ng/dL (ref 0.8–1.8)

## 2024-08-24 LAB — T3, FREE: T3, Free: 3.3 pg/mL (ref 2.3–4.2)

## 2024-08-24 LAB — TSH: TSH: 1.92 m[IU]/L (ref 0.40–4.50)

## 2024-09-01 ENCOUNTER — Ambulatory Visit: Payer: Medicare HMO | Admitting: Internal Medicine
